# Patient Record
Sex: Female | Born: 1965 | Race: White | Hispanic: No | Marital: Married | Smoking: Never smoker
Health system: Southern US, Community
[De-identification: ages and names within clinical notes are randomized; demographics above are authoritative.]

## PROBLEM LIST (undated history)

## (undated) DIAGNOSIS — E0500 Thyrotoxicosis with diffuse goiter without thyrotoxic crisis or storm: Secondary | ICD-10-CM

## (undated) DIAGNOSIS — J45909 Unspecified asthma, uncomplicated: Secondary | ICD-10-CM

## (undated) DIAGNOSIS — G479 Sleep disorder, unspecified: Secondary | ICD-10-CM

## (undated) DIAGNOSIS — J309 Allergic rhinitis, unspecified: Secondary | ICD-10-CM

## (undated) HISTORY — DX: Unspecified asthma, uncomplicated: J45.909

## (undated) HISTORY — DX: Thyrotoxicosis with diffuse goiter without thyrotoxic crisis or storm: E05.00

## (undated) HISTORY — DX: Allergic rhinitis, unspecified: J30.9

## (undated) HISTORY — DX: Sleep disorder, unspecified: G47.9

## (undated) HISTORY — PX: CHOLECYSTECTOMY: SHX55

---

## 2013-07-19 DIAGNOSIS — N920 Excessive and frequent menstruation with regular cycle: Secondary | ICD-10-CM | POA: Insufficient documentation

## 2013-07-19 HISTORY — PX: RADICAL HYSTERECTOMY: SHX2283

## 2015-04-27 ENCOUNTER — Ambulatory Visit (INDEPENDENT_AMBULATORY_CARE_PROVIDER_SITE_OTHER): Payer: PRIVATE HEALTH INSURANCE | Admitting: Internal Medicine

## 2015-04-27 ENCOUNTER — Encounter: Payer: Self-pay | Admitting: Internal Medicine

## 2015-04-27 VITALS — BP 136/94 | HR 76 | Temp 98.7°F | Resp 18 | Ht 65.75 in | Wt 297.6 lb

## 2015-04-27 DIAGNOSIS — J3089 Other allergic rhinitis: Secondary | ICD-10-CM | POA: Insufficient documentation

## 2015-04-27 DIAGNOSIS — J45909 Unspecified asthma, uncomplicated: Secondary | ICD-10-CM | POA: Insufficient documentation

## 2015-04-27 DIAGNOSIS — J4521 Mild intermittent asthma with (acute) exacerbation: Secondary | ICD-10-CM | POA: Diagnosis not present

## 2015-04-27 LAB — PULMONARY FUNCTION TEST

## 2015-04-27 NOTE — Assessment & Plan Note (Signed)
   Well controlled  Continue cetirizine (Zyrtec) 10 mg daily  Continue to use fluticasone 2 sprays each nostril daily as needed

## 2015-04-27 NOTE — Progress Notes (Signed)
History of Present Illness: Amber Steele is a 50 y.o. female presenting for a sick visit  HPI Comments: Asthma: Patient had been doing well for the past 2 yearsntil about one month ago when she developed an upper respiratory infection along with associated wheezing and coughing.She now coughs up a scant amount of mucus but it is mostly dry in nature. She uses albuterol 2-3 times a day with only transient improvement in her symptoms. She was able to stop Asmanex at last visit and has been just using iton an as-needed basis but she has not required it.  Allergic rhinitis: Patient completed a course of immunotherapy for tree and dust mite allergy in the past. Whenever she stops cetirizine, she notices return of her symptoms. She uses as needed albuterol. She has good symptom control and has not had any interval sinus infections.   Assessment and Plan: Asthma  Intermittent, currently not well controlled Given Prednisone 10 mg tablets. Take 2 tablets twice a day for 3 days, then 2 tablets on day #4, then 1 tablet on day #5. Given a nebulization treatment in the office with improvement in her symptoms She may continue to use Asmanex 220 g 2 puffs daily as burst therapy when she is sick. Stop when she is well Continue as needed Pro Air (albuterol)  Allergic rhinitis  Well controlled  Continue cetirizine (Zyrtec) 10 mg daily  Continue to use fluticasone 2 sprays each nostril daily as needed    Return in about 4 weeks (around 05/25/2015).  Medications ordered this encounter:  Meds ordered this encounter  Medications  . DISCONTD: azithromycin (ZITHROMAX) 250 MG tablet    Sig: take 2 tablets by mouth on day 1 then 1 tablet on days 2 through 5    Refill:  0  . benzonatate (TESSALON) 100 MG capsule    Sig: Reported on 04/27/2015    Refill:  0  . fluticasone (FLONASE) 50 MCG/ACT nasal spray    Sig: instill 2 sprays into each nostril once daily for 14 days    Refill:  0  . meloxicam (MOBIC)  15 MG tablet    Sig: Reported on 04/27/2015    Refill:  0  . metoprolol succinate (TOPROL-XL) 25 MG 24 hr tablet    Sig:     Refill:  1  . RA SALINE NASAL SPRAY 0.65 % nasal spray    Sig: USE 2 SPRAYS BY NASAL ROUTE AS NEEDED FOR CONGESTION    Refill:  0  . cetirizine (ZYRTEC) 10 MG tablet    Sig: Take 10 mg by mouth daily.  . Multiple Vitamin (MULTIVITAMIN) tablet    Sig: Take 1 tablet by mouth daily.  Marland Kitchen albuterol (PROAIR HFA) 108 (90 Base) MCG/ACT inhaler    Sig: Inhale 2 puffs into the lungs every 4 (four) hours as needed for wheezing or shortness of breath.  . mometasone (ASMANEX 14 METERED DOSES) 220 MCG/INH inhaler    Sig: Inhale 2 puffs into the lungs daily.  Marland Kitchen acetaminophen (TYLENOL) 325 MG tablet    Sig: Take 650 mg by mouth every 6 (six) hours as needed.    Diagnostics: Spirometry: FEV1 1.85L or 60%, FEV1/FVC  97%.  This is consistent with severe restriction. Significant improvement but not full reversibility following bronchodilators  Physical Exam: BP 136/94 mmHg  Pulse 76  Temp(Src) 98.7 F (37.1 C) (Oral)  Resp 18  Ht 5' 5.75" (1.67 m)  Wt 297 lb 9.6 oz (134.99 kg)  BMI 48.40 kg/m2  Physical Exam  Constitutional: She appears well-developed and well-nourished. No distress.  Obese  HENT:  Right Ear: External ear normal.  Left Ear: External ear normal.  Nose: Nose normal.  Mouth/Throat: Oropharynx is clear and moist.  Eyes: Conjunctivae are normal. Right eye exhibits no discharge. Left eye exhibits no discharge.  Cardiovascular: Normal rate, regular rhythm and normal heart sounds.   No murmur heard. Pulmonary/Chest: Effort normal and breath sounds normal. No respiratory distress. She has no wheezes. She has no rales.  Occasional dry cough  Abdominal: Soft. Bowel sounds are normal.  Musculoskeletal: She exhibits no edema.  Lymphadenopathy:    She has no cervical adenopathy.  Neurological: She is alert.  Skin: No rash noted.  Vitals  reviewed.   Medications: Current outpatient prescriptions:  .  acetaminophen (TYLENOL) 325 MG tablet, Take 650 mg by mouth every 6 (six) hours as needed., Disp: , Rfl:  .  albuterol (PROAIR HFA) 108 (90 Base) MCG/ACT inhaler, Inhale 2 puffs into the lungs every 4 (four) hours as needed for wheezing or shortness of breath., Disp: , Rfl:  .  benzonatate (TESSALON) 100 MG capsule, Reported on 04/27/2015, Disp: , Rfl: 0 .  cetirizine (ZYRTEC) 10 MG tablet, Take 10 mg by mouth daily., Disp: , Rfl:  .  fluticasone (FLONASE) 50 MCG/ACT nasal spray, instill 2 sprays into each nostril once daily for 14 days, Disp: , Rfl: 0 .  meloxicam (MOBIC) 15 MG tablet, Reported on 04/27/2015, Disp: , Rfl: 0 .  metoprolol succinate (TOPROL-XL) 25 MG 24 hr tablet, , Disp: , Rfl: 1 .  mometasone (ASMANEX 14 METERED DOSES) 220 MCG/INH inhaler, Inhale 2 puffs into the lungs daily., Disp: , Rfl:  .  Multiple Vitamin (MULTIVITAMIN) tablet, Take 1 tablet by mouth daily., Disp: , Rfl:  .  RA SALINE NASAL SPRAY 0.65 % nasal spray, USE 2 SPRAYS BY NASAL ROUTE AS NEEDED FOR CONGESTION, Disp: , Rfl: 0  Drug Allergies:  Allergies  Allergen Reactions  . Codeine   . Sulfa Antibiotics     ROS: Per HPI unless specifically indicated below Review of Systems  Thank you for the opportunity to care for this patient.  Please do not hesitate to contact me with questions.

## 2015-04-27 NOTE — Assessment & Plan Note (Signed)
   Intermittent, currently not well controlled Given Prednisone 10 mg tablets. Take 2 tablets twice a day for 3 days, then 2 tablets on day #4, then 1 tablet on day #5. Given a nebulization treatment in the office with improvement in her symptoms She may continue to use Asmanex 220 g 2 puffs daily as burst therapy when she is sick. Stop when she is well Continue as needed Pro Air (albuterol)

## 2015-04-27 NOTE — Patient Instructions (Signed)
Asthma  Intermittent, currently not well controlled Given Prednisone 10 mg tablets. Take 2 tablets twice a day for 3 days, then 2 tablets on day #4, then 1 tablet on day #5. Given a nebulization treatment in the office with improvement in her symptoms She may continue to use Asmanex 220 g 2 puffs daily as burst therapy when she is sick. Stop when she is well Continue as needed Pro Air (albuterol)  Allergic rhinitis  Well controlled  Continue cetirizine (Zyrtec) 10 mg daily  Continue to use fluticasone 2 sprays each nostril daily as needed

## 2015-04-30 ENCOUNTER — Encounter: Payer: Self-pay | Admitting: Internal Medicine

## 2015-05-03 ENCOUNTER — Encounter: Payer: Self-pay | Admitting: *Deleted

## 2015-05-04 ENCOUNTER — Other Ambulatory Visit: Payer: Self-pay

## 2015-05-04 MED ORDER — MOMETASONE FUROATE 220 MCG/INH IN AEPB: 2.0000 | INHALATION_SPRAY | Freq: Every day | RESPIRATORY_TRACT | Status: DC

## 2015-05-30 ENCOUNTER — Ambulatory Visit (INDEPENDENT_AMBULATORY_CARE_PROVIDER_SITE_OTHER): Payer: No Typology Code available for payment source | Admitting: Internal Medicine

## 2015-05-30 ENCOUNTER — Encounter: Payer: Self-pay | Admitting: Internal Medicine

## 2015-05-30 VITALS — BP 120/98 | HR 68 | Temp 98.3°F | Resp 16 | Ht 65.75 in | Wt 297.4 lb

## 2015-05-30 DIAGNOSIS — J3089 Other allergic rhinitis: Secondary | ICD-10-CM | POA: Diagnosis not present

## 2015-05-30 DIAGNOSIS — J4520 Mild intermittent asthma, uncomplicated: Secondary | ICD-10-CM | POA: Diagnosis not present

## 2015-05-30 MED ORDER — FLUTICASONE PROPIONATE 50 MCG/ACT NA SUSP: NASAL | Status: DC

## 2015-05-30 MED ORDER — MOMETASONE FUROATE 200 MCG/ACT IN AERO: 2.0000 | INHALATION_SPRAY | Freq: Two times a day (BID) | RESPIRATORY_TRACT | Status: DC

## 2015-05-30 MED ORDER — ALBUTEROL SULFATE HFA 108 (90 BASE) MCG/ACT IN AERS: 2.0000 | INHALATION_SPRAY | RESPIRATORY_TRACT | Status: DC | PRN

## 2015-05-30 MED ORDER — NYSTATIN 100000 UNIT/ML MT SUSP: OROMUCOSAL | Status: DC

## 2015-05-30 NOTE — Assessment & Plan Note (Addendum)
   Intermittent, currently well controlled Switch to Asmanex HFA 200 g 2 puffs twice a day with spacer. Rinse and gargle well after administration of the medication to prevent yeast Start nystatin swish and swallow 100,000 units per mL. She will use 5 ML 4 times a day for one to 2 weeks Continue as needed Pro Air (albuterol) After the spring, she may wean down the Asmanex and discontinue if symptoms remain well controlled.

## 2015-05-30 NOTE — Assessment & Plan Note (Addendum)
   Continue cetirizine (Zyrtec) 10 mg daily  Continue to use fluticasone 2 sprays each nostril daily as needed

## 2015-05-30 NOTE — Patient Instructions (Signed)
Allergic rhinitis  Continue cetirizine (Zyrtec) 10 mg daily  Continue to use fluticasone 2 sprays each nostril daily as needed   Asthma  Intermittent, currently well controlled Switch to Asmanex HFA 200 g 2 puffs twice a day with spacer. Rinse and gargle well after administration of the medication to prevent yeast Start nystatin swish and swallow 100,000 units per mL. She will use 5 ML 4 times a day for one to 2 weeks Continue as needed Pro Air (albuterol) After the spring, she may wean down the Asmanex and discontinue if symptoms remain well controlled.

## 2015-05-30 NOTE — Progress Notes (Signed)
History of Present Illness: Amber Steele is a 50 y.o. female presenting for follow-up.  HPI Comments: Asthma: At patient's last visit, symptoms had not been well-controlled so she was given a prednisone burst. She now feels that she is back to her baseline. She did try to wean the Asmanex but due to springtime allergens, she was not able to do so.  Allergic rhinitis: Patient completed a course of immunotherapy for tree and dust mite allergy in the past. With the onset of the spring, she has been having increased symptoms but is having good control with cetirizine and fluticasone.   Current Outpatient Prescriptions on File Prior to Visit  Medication Sig Dispense Refill  . acetaminophen (TYLENOL) 325 MG tablet Take 650 mg by mouth every 6 (six) hours as needed.    Marland Kitchen. albuterol (PROAIR HFA) 108 (90 Base) MCG/ACT inhaler Inhale 2 puffs into the lungs every 4 (four) hours as needed for wheezing or shortness of breath.    . cetirizine (ZYRTEC) 10 MG tablet Take 10 mg by mouth daily.    . fluticasone (FLONASE) 50 MCG/ACT nasal spray instill 2 sprays into each nostril once daily for 14 days  0  . meloxicam (MOBIC) 15 MG tablet Reported on 04/27/2015  0  . metoprolol succinate (TOPROL-XL) 25 MG 24 hr tablet   1  . Multiple Vitamin (MULTIVITAMIN) tablet Take 1 tablet by mouth daily.    Marland Kitchen. RA SALINE NASAL SPRAY 0.65 % nasal spray USE 2 SPRAYS BY NASAL ROUTE AS NEEDED FOR CONGESTION  0  . benzonatate (TESSALON) 100 MG capsule Reported on 05/30/2015  0   No current facility-administered medications on file prior to visit.    Assessment and Plan: Allergic rhinitis  Continue cetirizine (Zyrtec) 10 mg daily  Continue to use fluticasone 2 sprays each nostril daily as needed   Asthma  Intermittent, currently well controlled Switch to Asmanex HFA 200 g 2 puffs twice a day with spacer. Rinse and gargle well after administration of the medication to prevent yeast Start nystatin swish and swallow 100,000  units per mL. She will use 5 ML 4 times a day for one to 2 weeks Continue as needed Pro Air (albuterol) After the spring, she may wean down the Asmanex and discontinue if symptoms remain well controlled.     Return in about 6 months (around 11/30/2015).  Meds ordered this encounter  Medications  . diphenhydrAMINE (BENADRYL) 25 MG tablet    Sig: Take by mouth.  . DISCONTD: metoprolol tartrate (LOPRESSOR) 25 MG tablet    Sig: Take by mouth.  . DISCONTD: cetirizine (ZYRTEC) 10 MG tablet    Sig: Take by mouth.  . Multiple Vitamin (THERA) TABS    Sig: Take by mouth.  . DISCONTD: albuterol (PROAIR HFA) 108 (90 Base) MCG/ACT inhaler    Sig:   . DISCONTD: fluticasone (FLONASE) 50 MCG/ACT nasal spray    Sig: Place into the nose.  Marland Kitchen. DISCONTD: albuterol (PROAIR HFA) 108 (90 Base) MCG/ACT inhaler    Sig:   . DISCONTD: cetirizine (ZYRTEC) 10 MG tablet    Sig: Take 10 mg by mouth.  . DISCONTD: meloxicam (MOBIC) 15 MG tablet    Sig:   . DISCONTD: metoprolol succinate (TOPROL-XL) 25 MG 24 hr tablet    Sig:   . DISCONTD: sodium chloride (OCEAN) 0.65 % nasal spray    Sig: Place into the nose.    Diagnostics: Spirometry: FEV1 3.35L or 109%, FEV1/FVC  84%.  This is a normal study.  Patient had albuterol a few hours ago.  Physical Exam: BP 120/98 mmHg  Pulse 68  Temp(Src) 98.3 F (36.8 C) (Oral)  Resp 16  Ht 5' 5.75" (1.67 m)  Wt 297 lb 6.4 oz (134.9 kg)  BMI 48.37 kg/m2   Physical Exam  Constitutional: She appears well-developed and well-nourished. No distress.  HENT:  Right Ear: External ear normal.  Left Ear: External ear normal.  Nose: Nose normal.  Adherent white patches in the posterior pharynx  Eyes: Conjunctivae are normal. Right eye exhibits no discharge. Left eye exhibits no discharge.  Cardiovascular: Normal rate, regular rhythm and normal heart sounds.   No murmur heard. Pulmonary/Chest: Effort normal and breath sounds normal. No respiratory distress. She has no  wheezes. She has no rales.  Abdominal: Soft. Bowel sounds are normal.  Musculoskeletal: She exhibits no edema.  Lymphadenopathy:    She has no cervical adenopathy.  Neurological: She is alert.  Skin: No rash noted.  Vitals reviewed.   Drug Allergies:  Allergies  Allergen Reactions  . Sulfa Antibiotics Itching, Nausea Only and Rash  . Codeine Other (See Comments)    Arm pain Arm pain  . Fluoxetine Other (See Comments)    suicidal  . Fluoxetine Hcl Other (See Comments)    suicidal  . Oxycodone-Acetaminophen Other (See Comments)    Arm pain Arm pain    ROS: Per HPI unless specifically indicated below Review of Systems  Thank you for the opportunity to care for this patient.  Please do not hesitate to contact me with questions.

## 2015-06-07 ENCOUNTER — Encounter: Payer: Self-pay | Admitting: *Deleted

## 2015-10-25 ENCOUNTER — Telehealth: Payer: Self-pay | Admitting: Internal Medicine

## 2015-11-30 ENCOUNTER — Ambulatory Visit: Payer: PRIVATE HEALTH INSURANCE | Admitting: Allergy & Immunology

## 2015-12-24 ENCOUNTER — Other Ambulatory Visit: Payer: Self-pay | Admitting: Allergy

## 2015-12-24 MED ORDER — FLUTICASONE PROPIONATE 50 MCG/ACT NA SUSP: NASAL | 5 refills | Status: DC

## 2016-03-21 ENCOUNTER — Ambulatory Visit (INDEPENDENT_AMBULATORY_CARE_PROVIDER_SITE_OTHER): Payer: No Typology Code available for payment source | Admitting: Pediatrics

## 2016-03-21 ENCOUNTER — Encounter: Payer: Self-pay | Admitting: Pediatrics

## 2016-03-21 VITALS — BP 130/86 | HR 64 | Temp 98.6°F | Resp 16

## 2016-03-21 DIAGNOSIS — J3089 Other allergic rhinitis: Secondary | ICD-10-CM | POA: Diagnosis not present

## 2016-03-21 DIAGNOSIS — E669 Obesity, unspecified: Secondary | ICD-10-CM | POA: Diagnosis not present

## 2016-03-21 DIAGNOSIS — J4541 Moderate persistent asthma with (acute) exacerbation: Secondary | ICD-10-CM

## 2016-03-21 MED ORDER — MOMETASONE FUROATE 220 MCG/INH IN AEPB: 2.0000 | INHALATION_SPRAY | Freq: Two times a day (BID) | RESPIRATORY_TRACT | 5 refills | Status: DC

## 2016-03-21 MED ORDER — ALBUTEROL SULFATE (2.5 MG/3ML) 0.083% IN NEBU: 2.5000 mg | INHALATION_SOLUTION | RESPIRATORY_TRACT | 1 refills | Status: DC | PRN

## 2016-03-21 MED ORDER — ALBUTEROL SULFATE HFA 108 (90 BASE) MCG/ACT IN AERS: 2.0000 | INHALATION_SPRAY | RESPIRATORY_TRACT | 1 refills | Status: DC | PRN

## 2016-03-21 NOTE — Patient Instructions (Addendum)
Asmanex HFA 200-2 puffs twice a day for 2 weeks then back to 2 puffs once a day if you're cough and  wheeze free Pro-air 2 puffs every 4 hours if needed, or  instead albuterol 0.083% one unit dose every 4 hours if needed for coughing or wheezing Zyrtec 10 mg once a day Fluticasone 1 spray per nostril twice a day Add prednisone 20 mg twice a day for 3 days, 20 mg on the fourth day, 10 mg on the fifth day to bring your asthma under control Call me if you are not doing well on this treatment plan

## 2016-03-21 NOTE — Progress Notes (Signed)
[address] [place] [address] Dept: [phone]  FOLLOW UP NOTE  Patient ID: Amber Steele, female    DOB: 1966-02-19  Age: 51 y.o. MRN: [id number] Date of Office Visit: 03/21/2016  Assessment  Chief Complaint: Asthma (pt started coughing after christmas day, she had a low grade fever, drainage in throat)  HPI [name] presents for evaluation of some severe coughing spells and asthma attacks after having a cold about a week ago. She may have had a very low-grade fever but no chills or body aches. She usually takes Asmanex 200 HFA 2  puffs once a day but last week she increased it to 2 puffs twice a day. She had been doing well until recently following her last visit in March 2016. She has had mild nasal congestion.  Current medications are Asmanex 200 HFA-2 puffs twice a day, Zyrtec 10 mg once a day, Pro-air 2 puffs every 4 hours if needed or instead albuterol 0.083% one unit dose every 4 hours if needed, fluticasone 1 spray per nostril twice a day. Her other medications are outlined in the chart.     Drug Allergies:  Allergies  Allergen Reactions  . Sulfa Antibiotics Itching, Nausea Only and Rash  . Codeine Other (See Comments)    Arm pain Arm pain  . Fluoxetine Other (See Comments)    suicidal  . Fluoxetine Hcl Other (See Comments)    suicidal  . Oxycodone-Acetaminophen Other (See Comments)    Arm pain Arm pain    Physical Exam: BP 130/86   Pulse 64   Temp 98.6 F (37 C) (Oral)   Resp 16    Physical Exam  Constitutional: She is oriented to person, place, and time. She appears well-developed and well-nourished.  HENT:  Eyes normal. Ears normal. Nose mild swelling of nasal turbinates. Pharynx normal.  Neck: Neck supple.  Cardiovascular:  S1 and S2 normal no murmurs  Pulmonary/Chest:  Clear to percussion auscultation except for mild wheezing on end expiration  Lymphadenopathy:    She has no cervical adenopathy.  Neurological: She is alert and oriented  to person, place, and time.  Psychiatric: She has a normal mood and affect. Her behavior is normal. Judgment and thought content normal.  Vitals reviewed.   Diagnostics:  FVC 2.10 L FEV1 1.92 L. Predicted FVC 3.68 L predicted FEV1 2.92 L. After albuterol by nebulization the FVC 2.21 L FEV1 2.04 L-this shows a moderate reduction in the forced vital capacity with no  significant improvement after albuterol  Assessment and Plan: 1. Moderate persistent asthma with acute exacerbation   2. Other allergic rhinitis   3. Obesity (BMI 30-39.9)     Meds ordered this encounter  Medications  . albuterol (PROAIR HFA) 108 (90 Base) MCG/ACT inhaler    Sig: Inhale 2 puffs into the lungs every 4 (four) hours as needed for wheezing or shortness of breath.    Dispense:  1 Inhaler    Refill:  1  . mometasone (ASMANEX 120 METERED DOSES) 220 MCG/INH inhaler    Sig: Inhale 2 puffs into the lungs 2 (two) times daily.    Dispense:  1 Inhaler    Refill:  5    To prevent cough or wheeze.  Marland Kitchen albuterol (PROVENTIL) (2.5 MG/3ML) 0.083% nebulizer solution    Sig: Take 3 mLs (2.5 mg total) by nebulization every 4 (four) hours as needed for wheezing or shortness of breath.    Dispense:  75 mL    Refill:  1  Patient Instructions  Asmanex HFA 200-2 puffs twice a day for 2 weeks then back to 2 puffs once a day if you're cough and  wheeze free Pro-air 2 puffs every 4 hours if needed, or  instead albuterol 0.083% one unit dose every 4 hours if needed for coughing or wheezing Zyrtec 10 mg once a day Fluticasone 1 spray per nostril twice a day Add prednisone 20 mg twice a day for 3 days, 20 mg on the fourth day, 10 mg on the fifth day to bring your asthma under control Call me if you are not doing well on this treatment plan   Return in about 6 weeks (around 05/02/2016).    Thank you for the opportunity to care for this patient.  Please do not hesitate to contact me with questions.  Tonette BihariJ. A. Bardelas,  M.D.  [facility] [address] [place], [place] [address] [phone]

## 2016-06-04 ENCOUNTER — Other Ambulatory Visit: Payer: Self-pay | Admitting: Allergy

## 2016-06-04 MED ORDER — FLUTICASONE PROPIONATE 50 MCG/ACT NA SUSP: NASAL | 5 refills | Status: DC

## 2018-09-06 ENCOUNTER — Other Ambulatory Visit: Payer: Self-pay | Admitting: *Deleted

## 2018-09-06 MED ORDER — ALBUTEROL SULFATE (2.5 MG/3ML) 0.083% IN NEBU: 2.5000 mg | INHALATION_SOLUTION | RESPIRATORY_TRACT | 0 refills | Status: DC | PRN

## 2018-09-06 NOTE — Telephone Encounter (Signed)
Spoke with pt- humidity is flaring her asthma. She is using proair 2-3 puffs for the past 3-4 days. She is also using flovent 170mcg 2 puffs bid. Pt has nebulizer and albuterol 0.083 that she thinks is expired- I sent new script of albuterol 0.083 to pharmacy. Instructed her to try the treatments in place of using proair to help open her up more. Pt has apt 6/29 and if she is not any better by Wednesday he will call back.

## 2018-09-13 ENCOUNTER — Encounter: Payer: Self-pay | Admitting: Pediatrics

## 2018-09-13 ENCOUNTER — Other Ambulatory Visit: Payer: Self-pay

## 2018-09-13 ENCOUNTER — Ambulatory Visit (INDEPENDENT_AMBULATORY_CARE_PROVIDER_SITE_OTHER): Payer: PRIVATE HEALTH INSURANCE | Admitting: Pediatrics

## 2018-09-13 VITALS — BP 118/68 | HR 78 | Temp 98.2°F | Resp 18 | Ht 65.5 in | Wt 311.0 lb

## 2018-09-13 DIAGNOSIS — J3089 Other allergic rhinitis: Secondary | ICD-10-CM | POA: Diagnosis not present

## 2018-09-13 DIAGNOSIS — J4541 Moderate persistent asthma with (acute) exacerbation: Secondary | ICD-10-CM

## 2018-09-13 DIAGNOSIS — Z6841 Body Mass Index (BMI) 40.0 and over, adult: Secondary | ICD-10-CM | POA: Diagnosis not present

## 2018-09-13 MED ORDER — BUDESONIDE-FORMOTEROL FUMARATE 160-4.5 MCG/ACT IN AERO: 2.0000 | INHALATION_SPRAY | Freq: Two times a day (BID) | RESPIRATORY_TRACT | 5 refills | Status: DC

## 2018-09-13 NOTE — Patient Instructions (Addendum)
Asthma Begin Symbicort 160-2 puffs twice a day with a spacer to prevent cough and wheeze. This will replace Flovent 110 Continue ProAir 2 puffs every 4 hours as needed for cough or wheeze or instead albuterol via nebulizer every 4 hours as needed for cough and wheeze Begin prednisone 10 mg tablets. Take 2 tablets twice a day for 3 days, then take 2 tablets for 1 day, then take 1 tablet on the 5th day, then stop  Allergic rhinitis Continue cetirizine 10 mg once a day as needed for a runny nose Continue Flonase 2 sprays in each nostril once a day as needed for a stuffy nose Consider nasal saline rinses as needed for nasal symptoms  Call the clinic if this treatment plan is not working well for you  Follow up in 4 weeks or sooner if needed

## 2018-09-13 NOTE — Progress Notes (Signed)
[address] [place] [address] Dept: [phone]  FOLLOW UP NOTE  Patient ID: Amber Steele, female    DOB: 07-22-1965  Age: 53 y.o. MRN: [id number] Date of Office Visit: 09/13/2018  Assessment  Chief Complaint: Asthma (tightness in chest, some wheezing, "quivering in her lungs". states this has been ongoing for some time now and she felt about 2-3 weeks that she was worsening to the point that she needed to be seen. )  HPI Amber Steele is a 53 year old female who presents to the clinic for an asthma exacerbation. She was last seen in this clinic on 03/21/2016 by Dr. Shaune Leeks. At today's visit, she reports that her asthma has not been well controlled. She reports she has been feeling ill since late December and her breathing began to worsen in March with the increase in humidity. At that time, she increased her Flovent 110 to 2 puffs twice a day and was using her albuterol infrequently. At that time, she began to feel a soreness in her chest bilaterally mid rib. One week ago she increased her albuterol use to 2-3 times a day and continues on Flovent 110-2 puffs twice a day. At today's visit, she reports her breathing is beginning to improve, however, she continues to experience shortness of breath worse with activity, and a little cough with the shortness of breath. She reports the "weird pains" in her rib area are beginning to resolve and right is more painful than the left side. Allergic rhinitis is reported as well controlled with Flonase and cetirizine daily. Her current medications are listed in the chart.   Drug Allergies:  Allergies  Allergen Reactions  . Sulfa Antibiotics Itching, Nausea Only and Rash  . Codeine Other (See Comments)    Arm pain Arm pain  . Fluoxetine Other (See Comments)    suicidal  . Fluoxetine Hcl Other (See Comments)    suicidal  . Oxycodone-Acetaminophen Other (See Comments)    Arm pain Arm pain    Physical Exam: BP 118/68 (BP Location: Right Arm,  Patient Position: Sitting, Cuff Size: Large)   Pulse 78   Temp 98.2 F (36.8 C) (Oral)   Resp 18   Ht 5' 5.5" (1.664 m)   Wt (!) 311 lb (141.1 kg)   SpO2 97%   BMI 50.97 kg/m    Physical Exam Vitals signs reviewed.  Constitutional:      Appearance: Normal appearance.  HENT:     Head: Normocephalic and atraumatic.     Right Ear: Tympanic membrane normal.     Left Ear: Tympanic membrane normal.     Nose:     Comments: Bilateral nares normal.  Pharynx normal.  Ears normal.  Eyes normal.    Mouth/Throat:     Pharynx: Oropharynx is clear.  Eyes:     Conjunctiva/sclera: Conjunctivae normal.  Neck:     Musculoskeletal: Normal range of motion and neck supple.  Cardiovascular:     Rate and Rhythm: Normal rate and regular rhythm.     Heart sounds: Normal heart sounds. No murmur.  Pulmonary:     Effort: Pulmonary effort is normal.     Breath sounds: Normal breath sounds.     Comments: Lungs clear to auscultation Musculoskeletal: Normal range of motion.     Comments: Bilateral rib pain reproducible by gentle pressure  Skin:    General: Skin is warm and dry.  Neurological:     Mental Status: She is alert and oriented to person, place,  and time.  Psychiatric:        Mood and Affect: Mood normal.        Behavior: Behavior normal.        Thought Content: Thought content normal.        Judgment: Judgment normal.     Diagnostics: FVC 3.01, FEV1 2.86.  Predicted FVC 3.79, predicted FEV1 2.99.  Spirometry indicates mild restriction.  This is consistent with previous spirometry readings.  Assessment and Plan: 1. Moderate persistent asthma with acute exacerbation   2. Other allergic rhinitis   3. Class 3 severe obesity with body mass index (BMI) of 50.0 to 59.9 in adult, unspecified obesity type, unspecified whether serious comorbidity present (HCC)     Meds ordered this encounter  Medications  . budesonide-formoterol (SYMBICORT) 160-4.5 MCG/ACT inhaler    Sig: Inhale 2 puffs  into the lungs 2 (two) times daily.    Dispense:  1 Inhaler    Refill:  5    Patient Instructions  Asthma Begin Symbicort 160-2 puffs twice a day with a spacer to prevent cough and wheeze. This will replace Flovent 110 Continue ProAir 2 puffs every 4 hours as needed for cough or wheeze or instead albuterol via nebulizer every 4 hours as needed for cough and wheeze Begin prednisone 10 mg tablets. Take 2 tablets twice a day for 3 days, then take 2 tablets for 1 day, then take 1 tablet on the 5th day, then stop  Allergic rhinitis Continue cetirizine 10 mg once a day as needed for a runny nose Continue Flonase 2 sprays in each nostril once a day as needed for a stuffy nose Consider nasal saline rinses as needed for nasal symptoms  Call the clinic if this treatment plan is not working well for you  Follow up in 4 weeks or sooner if needed   Return in about 4 weeks (around 10/11/2018), or if symptoms worsen or fail to improve.   Thank you for the opportunity to care for this patient.  Please do not hesitate to contact me with questions.  Thermon LeylandAnne Ambs, FNP [facility]  I have provided oversight concerning Thermon Leylandnne Ambs' evaluation and treatment of this patient's health issues addressed during today's encounter. I agree with the assessment and therapeutic plan as outlined in the note.   Thank you for the opportunity to care for this patient.  Please do not hesitate to contact me with questions.  Tonette BihariJ. A. Aquita Simmering, M.D.  [facility] [address] [place], [place] [address] [phone]

## 2018-09-22 ENCOUNTER — Telehealth: Payer: Self-pay | Admitting: Pediatrics

## 2018-09-22 MED ORDER — PREDNISONE 10 MG PO TABS: ORAL_TABLET | ORAL | 0 refills | Status: DC

## 2018-09-22 MED ORDER — ALBUTEROL SULFATE (2.5 MG/3ML) 0.083% IN NEBU: 2.5000 mg | INHALATION_SOLUTION | RESPIRATORY_TRACT | 1 refills | Status: DC | PRN

## 2018-09-22 NOTE — Telephone Encounter (Signed)
Toward the end of the prednisone pak she was able to walk incline and was doing much better. She finished the prednisone on Friday and since Monday she has been gasping and coughing again. She is using sybmicort as precribed and albuterol nebulizer treatments 1-2 times a day. Please advise.

## 2018-09-22 NOTE — Telephone Encounter (Signed)
PT called to report she completed prednisone pack, 5 days ago. Tapered off as directed. Current symptoms cough, chest tightness. Using symbicort 2 puffs in am, 2 puffs in pm and also doing breathing treatments.  Wants to know if she can get more tubing and if it is okay to continue breathing treatments.

## 2018-09-22 NOTE — Telephone Encounter (Signed)
Pt informed

## 2018-09-22 NOTE — Telephone Encounter (Signed)
Can you please add on a day pred pack and also add the Flovent 110-2 puffs twice a day back into her routine for 2 weeks(in addition to Symbicort). If symptoms are worse or not significantly improved by next week,please have her come in to check breathing

## 2018-10-11 ENCOUNTER — Other Ambulatory Visit: Payer: Self-pay

## 2018-10-11 ENCOUNTER — Encounter: Payer: Self-pay | Admitting: Family Medicine

## 2018-10-11 ENCOUNTER — Ambulatory Visit (INDEPENDENT_AMBULATORY_CARE_PROVIDER_SITE_OTHER): Payer: PRIVATE HEALTH INSURANCE | Admitting: Family Medicine

## 2018-10-11 VITALS — BP 110/78 | HR 64 | Temp 98.5°F | Resp 16

## 2018-10-11 DIAGNOSIS — J4541 Moderate persistent asthma with (acute) exacerbation: Secondary | ICD-10-CM

## 2018-10-11 DIAGNOSIS — Z6841 Body Mass Index (BMI) 40.0 and over, adult: Secondary | ICD-10-CM

## 2018-10-11 DIAGNOSIS — J3089 Other allergic rhinitis: Secondary | ICD-10-CM | POA: Diagnosis not present

## 2018-10-11 MED ORDER — MONTELUKAST SODIUM 10 MG PO TABS: ORAL_TABLET | ORAL | 5 refills | Status: DC

## 2018-10-11 NOTE — Patient Instructions (Signed)
Asthma Begin montelukast 10- mg once a day to prevent cough or wheeze Continue Smbicort 160-2 puffs twice a day with a spacer to prevent cough and wheeze. This will replace Flovent 110 Continue ProAir 2 puffs every 4 hours as needed for cough or wheeze or instead albuterol via nebulizer every 4 hours as needed for cough and wheeze  Allergic rhinitis Continue cetirizine 10 mg once a day as needed for a runny nose Continue Flonase 2 sprays in each nostril once a day as needed for a stuffy nose Consider nasal saline rinses as needed for nasal symptoms  Call the clinic if this treatment plan is not working well for you  Follow up in 2 months or sooner if needed

## 2018-10-11 NOTE — Progress Notes (Signed)
[address] [place] [id number] Dept: [phone]  FOLLOW UP NOTE  Patient ID: Amber Steele, female    DOB: 1966/01/11  Age: 53 y.o. MRN: [id number] Date of Office Visit: 10/11/2018  Assessment  Chief Complaint: Asthma (still having SOB and chest tightness)  HPI Amber Steele is a 53 year old female who presents to the clinic for a follow up visit. She reports her asthma has improved since her last visit to this clinic. She required two rounds of prednisone and the addition of Flovent 110 to Symbicort 160. She continues to experience shortness of breath with a "popping and crackling feeling", and a "minor dry cough" that occurs a couple times a week. She reports that she continues to experience chest tightness when she is exposed to hot or humid weather. She continues to use Symbicort 160-2 puffs twice a day with a spacer and albuterol about 1 time a week.  She reports that she continues to experience tenderness in her right side below her breast and in her back.  She reports that when she is at home she can relax this area and it is not painful.  She denies fever and productive cough.  Allergic rhinitis is reported as well controlled with Flonase and cetirizine once a day.  Her current medications are listed in the chart   Drug Allergies:  Allergies  Allergen Reactions  . Sulfa Antibiotics Itching, Nausea Only and Rash  . Codeine Other (See Comments)    Arm pain   . Fluoxetine Hcl Other (See Comments)    suicidal  . Oxycodone-Acetaminophen Other (See Comments)    Arm pain     Physical Exam: BP 110/78   Pulse 64   Temp 98.5 F (36.9 C) (Oral)   Resp 16   SpO2 96%    Physical Exam Vitals signs reviewed.  Constitutional:      Appearance: Normal appearance.  HENT:     Head: Normocephalic and atraumatic.     Right Ear: Tympanic membrane normal.     Left Ear: Tympanic membrane normal.     Nose:     Comments: Bilateral nares slightly erythematous with clear nasal  drainage noted.  Pharynx normal.  Ears normal.  Eyes normal.    Mouth/Throat:     Pharynx: Oropharynx is clear.  Eyes:     Conjunctiva/sclera: Conjunctivae normal.  Neck:     Musculoskeletal: Normal range of motion and neck supple.  Cardiovascular:     Rate and Rhythm: Normal rate and regular rhythm.     Heart sounds: Normal heart sounds. No murmur.  Pulmonary:     Effort: Pulmonary effort is normal.     Breath sounds: Normal breath sounds.     Comments: Lungs clear to auscultation Musculoskeletal: Normal range of motion.     Comments: Right second rib cage non-reproducible by pressure  Skin:    General: Skin is warm and dry.  Neurological:     Mental Status: She is alert and oriented to person, place, and time.  Psychiatric:        Mood and Affect: Mood normal.        Behavior: Behavior normal.        Thought Content: Thought content normal.        Judgment: Judgment normal.     Diagnostics: FVC 3.60, FEV1 3.30.  Predicted FVC 3.79, predicted FEV1 2.99.  Spirometry indicates normal ventilatory function.  Assessment and Plan: 1. Moderate persistent asthma with acute exacerbation   2.  Other allergic rhinitis   3. Class 3 severe obesity with body mass index (BMI) of 50.0 to 59.9 in adult, unspecified obesity type, unspecified whether serious comorbidity present ([place])     Meds ordered this encounter  Medications  . montelukast (SINGULAIR) 10 MG tablet    Sig: One tablet at bedtime to prevent coughing or wheezing    Dispense:  30 tablet    Refill:  5    Patient Instructions  Asthma Begin montelukast 10- mg once a day to prevent cough or wheeze Continue Smbicort 160-2 puffs twice a day with a spacer to prevent cough and wheeze. This will replace Flovent 110 Continue ProAir 2 puffs every 4 hours as needed for cough or wheeze or instead albuterol via nebulizer every 4 hours as needed for cough and wheeze  Allergic rhinitis Continue cetirizine 10 mg once a day as needed  for a runny nose Continue Flonase 2 sprays in each nostril once a day as needed for a stuffy nose Consider nasal saline rinses as needed for nasal symptoms  Call the clinic if this treatment plan is not working well for you  Follow up in 2 months or sooner if needed   Return in about 2 months (around 12/12/2018), or if symptoms worsen or fail to improve.   Thank you for the opportunity to care for this patient.  Please do not hesitate to contact me with questions.  Gareth Morgan, FNP [facility]  I have provided oversight concerning Gareth Morgan' evaluation and treatment of this patient's health issues addressed during today's encounter. I agree with the assessment and therapeutic plan as outlined in the note.   Thank you for the opportunity to care for this patient.  Please do not hesitate to contact me with questions.  Penne Lash, M.D.  [facility] [address] [place], [place] [address] [phone]

## 2018-10-12 ENCOUNTER — Telehealth: Payer: Self-pay | Admitting: *Deleted

## 2018-10-12 DIAGNOSIS — R0781 Pleurodynia: Secondary | ICD-10-CM

## 2018-10-12 NOTE — Telephone Encounter (Signed)
Spoke with pt and ordered chest xray.

## 2018-10-12 NOTE — Telephone Encounter (Signed)
-----   Message from Dara Hoyer, FNP sent at 10/11/2018  6:38 PM EDT ----- Can you please order a 2 view chest xray for this patient for right sided rib pain lasting over 6 weeks. Please let her know of the order. Thank you

## 2018-10-13 ENCOUNTER — Other Ambulatory Visit: Payer: Self-pay

## 2018-10-13 ENCOUNTER — Ambulatory Visit (HOSPITAL_BASED_OUTPATIENT_CLINIC_OR_DEPARTMENT_OTHER)
Admission: RE | Admit: 2018-10-13 | Discharge: 2018-10-13 | Disposition: A | Discharge status: Home / Self Care | Payer: 59 | Source: Ambulatory Visit | Attending: Family Medicine | Admitting: Family Medicine

## 2018-10-13 DIAGNOSIS — R0781 Pleurodynia: Secondary | ICD-10-CM | POA: Diagnosis present

## 2018-10-14 NOTE — Telephone Encounter (Signed)
Can you please let this patient know that her cxr indicates both lungs are clear and skeletal structures are normal. Thank you

## 2018-10-18 NOTE — Telephone Encounter (Signed)
Can you please try to call this patient once more to give her the cxr result. If no answer can we please mail? Thank you

## 2018-12-16 ENCOUNTER — Ambulatory Visit: Payer: No Typology Code available for payment source | Admitting: Family Medicine

## 2018-12-27 ENCOUNTER — Ambulatory Visit (INDEPENDENT_AMBULATORY_CARE_PROVIDER_SITE_OTHER): Payer: PRIVATE HEALTH INSURANCE | Admitting: Family Medicine

## 2018-12-27 ENCOUNTER — Encounter: Payer: Self-pay | Admitting: Family Medicine

## 2018-12-27 ENCOUNTER — Other Ambulatory Visit: Payer: Self-pay

## 2018-12-27 VITALS — BP 98/68 | HR 64 | Temp 98.1°F | Resp 12 | Ht 65.0 in | Wt 311.6 lb

## 2018-12-27 DIAGNOSIS — Z23 Encounter for immunization: Secondary | ICD-10-CM | POA: Diagnosis not present

## 2018-12-27 DIAGNOSIS — J3089 Other allergic rhinitis: Secondary | ICD-10-CM

## 2018-12-27 DIAGNOSIS — Z6841 Body Mass Index (BMI) 40.0 and over, adult: Secondary | ICD-10-CM

## 2018-12-27 DIAGNOSIS — J4540 Moderate persistent asthma, uncomplicated: Secondary | ICD-10-CM

## 2018-12-27 DIAGNOSIS — K219 Gastro-esophageal reflux disease without esophagitis: Secondary | ICD-10-CM | POA: Diagnosis not present

## 2018-12-27 MED ORDER — FAMOTIDINE 20 MG PO TABS: ORAL_TABLET | ORAL | 5 refills | Status: DC

## 2018-12-27 NOTE — Patient Instructions (Addendum)
Asthma Continue montelukast 10- mg once a day to prevent cough or wheeze Continue Smbicort 160-2 puffs twice a day with a spacer to prevent cough and wheeze. This will replace Flovent 110 Continue ProAir 2 puffs every 4 hours as needed for cough or wheeze or instead albuterol via nebulizer every 4 hours as needed for cough and wheeze. You may use ProAir 5-15 minutes before exercise to prevent cough or wheeze  Allergic rhinitis Continue cetirizine 10 mg once a day as needed for a runny nose Continue Flonase 2 sprays in each nostril once a day as needed for a stuffy nose Consider nasal saline rinses as needed for nasal symptoms  Reflux Begin famotidine 20 mg twice a day to decrease reflux Continue dietary and lifestyle modification as below  Call the clinic if this treatment plan is not working well for you  Follow up in 2 months or sooner if needed   Lifestyle Changes for Controlling GERD When you have GERD, stomach acid feels as if it's backing up toward your mouth. Whether or not you take medication to control your GERD, your symptoms can often be improved with lifestyle changes.   Raise Your Head  Reflux is more likely to strike when you're lying down flat, because stomach fluid can  flow backward more easily. Raising the head of your bed 4-6 inches can help. To do this:  Slide blocks or books under the legs at the head of your bed. Or, place a wedge under  the mattress. Many foam stores can make a suitable wedge for you. The wedge  should run from your waist to the top of your head.  Don't just prop your head on several pillows. This increases pressure on your  stomach. It can make GERD worse.  Watch Your Eating Habits Certain foods may increase the acid in your stomach or relax the lower esophageal sphincter, making GERD more likely. It's best to avoid the following:  Coffee, tea, and carbonated drinks (with and without caffeine)  Fatty, fried, or spicy food  Mint,  chocolate, onions, and tomatoes  Any other foods that seem to irritate your stomach or cause you pain  Relieve the Pressure  Eat smaller meals, even if you have to eat more often.  Don't lie down right after you eat. Wait a few hours for your stomach to empty.  Avoid tight belts and tight-fitting clothes.  Lose excess weight.  Tobacco and Alcohol  Avoid smoking tobacco and drinking alcohol. They can make GERD symptoms worse.

## 2018-12-27 NOTE — Progress Notes (Signed)
[address] [place] [address] Dept: [phone]  FOLLOW UP NOTE  Patient ID: Amber Steele, female    DOB: 12/12/1965  Age: 53 y.o. MRN: [id number] Date of Office Visit: 12/27/2018  Assessment  Chief Complaint: Asthma  HPI Amber Steele is a 53 year old female who presents to the clinic for a follow up visit. She reports her asthma is "much better" than at the last visit. She reports occasional shortness of breath and wheezing which is worst while wearing the mask and when lying down. She continues montelukast 10 mg once a day, Symbicort 160-2 puffs twcie a day with a spacer, and uses her albuterol infrequently. She denies heartburn of reflux at this time, however, she reports that she "can't breathe" while lying down and sleeps propped up in order to breathe. Allergic rhinitis is reported as moderately well controlled with occasional clear rhinorrhea, post nasal drainage, and scratchy throat for which she uses cetirizine, saline nasal rinses and Flonase daily with relief of symptoms. Her current medications are listed in the chart.    Drug Allergies:  Allergies  Allergen Reactions  . Sulfa Antibiotics Itching, Nausea Only and Rash  . Codeine Other (See Comments)    Arm pain   . Fluoxetine Hcl Other (See Comments)    suicidal  . Oxycodone-Acetaminophen Other (See Comments)    Arm pain     Physical Exam: BP 98/68 (BP Location: Right Arm, Patient Position: Sitting, Cuff Size: Large)   Pulse 64   Temp 98.1 F (36.7 C) (Oral)   Resp 12   Ht 5\' 5"  (1.651 m)   Wt (!) 311 lb 9.6 oz (141.3 kg)   SpO2 97%   BMI 51.85 kg/m    Physical Exam Vitals signs reviewed.  HENT:     Head: Normocephalic and atraumatic.     Nose:     Comments: Bilateral nares slightly erythematous with no nasal drainage noted. Pharynx normal. Ears normal. Eyes normal.    Mouth/Throat:     Pharynx: Oropharynx is clear.  Eyes:     Conjunctiva/sclera: Conjunctivae normal.  Neck:   Musculoskeletal: Normal range of motion and neck supple.  Cardiovascular:     Rate and Rhythm: Normal rate and regular rhythm.     Heart sounds: Normal heart sounds. No murmur.  Pulmonary:     Effort: Pulmonary effort is normal.     Breath sounds: Normal breath sounds.     Comments: Lungs clear to auscultation Musculoskeletal: Normal range of motion.  Skin:    General: Skin is warm and dry.  Neurological:     Mental Status: She is alert and oriented to person, place, and time.  Psychiatric:        Mood and Affect: Mood normal.        Behavior: Behavior normal.        Thought Content: Thought content normal.        Judgment: Judgment normal.     Diagnostics: FVC 3.60, FEV1 3.16. Predicted FVC 3.64, FEV1 2.87. Spirometry indicates normal ventilatory function.   Assessment and Plan: 1. Moderate persistent asthma without complication   2. Other allergic rhinitis   3. Class 3 severe obesity with body mass index (BMI) of 50.0 to 59.9 in adult, unspecified obesity type, unspecified whether serious comorbidity present ([facility])   4. Gastroesophageal reflux disease, unspecified whether esophagitis present   5. Need for prophylactic vaccination and inoculation against influenza     Meds ordered this encounter  Medications  .  famotidine (PEPCID) 20 MG tablet    Sig: Take 1 tablet twice a day to decrease reflux.    Dispense:  64 tablet    Refill:  5    Patient Instructions  Asthma Continue montelukast 10- mg once a day to prevent cough or wheeze Continue Smbicort 160-2 puffs twice a day with a spacer to prevent cough and wheeze. This will replace Flovent 110 Continue ProAir 2 puffs every 4 hours as needed for cough or wheeze or instead albuterol via nebulizer every 4 hours as needed for cough and wheeze. You may use ProAir 5-15 minutes before exercise to prevent cough or wheeze  Allergic rhinitis Continue cetirizine 10 mg once a day as needed for a runny nose Continue Flonase 2 sprays  in each nostril once a day as needed for a stuffy nose Consider nasal saline rinses as needed for nasal symptoms  Reflux Begin famotidine 20 mg twice a day to decrease reflux Continue dietary and lifestyle modification as below  Call the clinic if this treatment plan is not working well for you  Follow up in 2 months or sooner if needed  Return in about 2 months (around 02/26/2019), or if symptoms worsen or fail to improve.   Thank you for the opportunity to care for this patient.  Please do not hesitate to contact me with questions.  Gareth Morgan, FNP [facility]  I have provided oversight concerning Gareth Morgan' evaluation and treatment of this patient's health issues addressed during today's encounter. I agree with the assessment and therapeutic plan as outlined in the note.   Thank you for the opportunity to care for this patient.  Please do not hesitate to contact me with questions.  Penne Lash, M.D.  [facility] [address] [place], [place] [address] [phone]

## 2019-03-02 ENCOUNTER — Ambulatory Visit: Payer: PRIVATE HEALTH INSURANCE | Admitting: Family Medicine

## 2019-04-05 ENCOUNTER — Other Ambulatory Visit: Payer: Self-pay

## 2019-04-05 MED ORDER — BUDESONIDE-FORMOTEROL FUMARATE 160-4.5 MCG/ACT IN AERO: 2.0000 | INHALATION_SPRAY | Freq: Two times a day (BID) | RESPIRATORY_TRACT | 0 refills | Status: DC

## 2019-05-08 ENCOUNTER — Other Ambulatory Visit: Payer: Self-pay | Admitting: Family Medicine

## 2019-05-17 ENCOUNTER — Other Ambulatory Visit: Payer: Self-pay | Admitting: Family Medicine

## 2019-06-06 ENCOUNTER — Other Ambulatory Visit: Payer: Self-pay | Admitting: Family Medicine

## 2019-06-17 ENCOUNTER — Other Ambulatory Visit: Payer: Self-pay | Admitting: Family Medicine

## 2019-08-08 ENCOUNTER — Ambulatory Visit (INDEPENDENT_AMBULATORY_CARE_PROVIDER_SITE_OTHER): Payer: PRIVATE HEALTH INSURANCE | Admitting: Allergy

## 2019-08-08 ENCOUNTER — Other Ambulatory Visit: Payer: Self-pay

## 2019-08-08 ENCOUNTER — Encounter: Payer: Self-pay | Admitting: Allergy

## 2019-08-08 VITALS — BP 128/100 | HR 70 | Temp 97.8°F | Resp 19 | Ht 66.0 in | Wt 310.6 lb

## 2019-08-08 DIAGNOSIS — J3089 Other allergic rhinitis: Secondary | ICD-10-CM | POA: Diagnosis not present

## 2019-08-08 DIAGNOSIS — J4541 Moderate persistent asthma with (acute) exacerbation: Secondary | ICD-10-CM | POA: Diagnosis not present

## 2019-08-08 DIAGNOSIS — K219 Gastro-esophageal reflux disease without esophagitis: Secondary | ICD-10-CM | POA: Diagnosis not present

## 2019-08-08 MED ORDER — BUDESONIDE-FORMOTEROL FUMARATE 160-4.5 MCG/ACT IN AERO: 2.0000 | INHALATION_SPRAY | Freq: Two times a day (BID) | RESPIRATORY_TRACT | 5 refills | Status: DC

## 2019-08-08 MED ORDER — AZELASTINE HCL 0.1 % NA SOLN: 1.0000 | Freq: Two times a day (BID) | NASAL | 5 refills | Status: DC | PRN

## 2019-08-08 MED ORDER — ALBUTEROL SULFATE HFA 108 (90 BASE) MCG/ACT IN AERS: 2.0000 | INHALATION_SPRAY | RESPIRATORY_TRACT | 1 refills | Status: DC | PRN

## 2019-08-08 MED ORDER — MONTELUKAST SODIUM 10 MG PO TABS: ORAL_TABLET | ORAL | 5 refills | Status: DC

## 2019-08-08 NOTE — Assessment & Plan Note (Signed)
Was doing well up until 1 week ago and having increased dyspnea on exertion, chest tightness, coughing. She also had temp of 99.5 but negative covid-19 test. ran out of Singulair 1 month ago and decreased Symbicort to 1 puff twice a day around the same time.  Not up to date with COVID-19 vaccine.  ACT score 16.  Today's spirometry was unremarkable.  She is most likely having an acute exacerbation due to decrease of medications.  Start prednisone taper. Prednisone 10mg  tablets - take 2 tablets for 4 days then 1 tablet on day 5.  . Use albuterol nebulizer before using Symbicort twice a day for the next few days.  . Daily controller medication(s): continue with Symbicort 160 2 puffs twice a day with spacer and rinse mouth afterwards. o Restart montelukast (singulair) 10mg  daily at night.  . Prior to physical activity: May use albuterol rescue inhaler 2 puffs 5 to 15 minutes prior to strenuous physical activities. Rescue medications: May use albuterol rescue inhaler 2 puffs every 4 to 6 hours as needed for shortness of breath, chest tightness, coughing, and wheezing. Monitor frequency of use.  . Repeat spirometry at next visit. . If still having issues then consider adding Spiriva 1.15mcg to above regimen.

## 2019-08-08 NOTE — Assessment & Plan Note (Signed)
Stable with below regimen. Having some PND now.  Continue with Zyrtec 10mg  at night.  Nasal saline spray (i.e., Simply Saline) or nasal saline lavage (i.e., NeilMed) is recommended as needed and prior to medicated nasal sprays.  Continue with Flonase 2 sprays once a day.  May use azelastine 1-2 sprays per nostril twice a day for drainage.  Consider repeat testing in future.

## 2019-08-08 NOTE — Patient Instructions (Addendum)
Asthma: Start prednisone taper. Prednisone 10mg  tablets - take 2 tablets for 4 days then 1 tablet on day 5.  . Use your albuterol nebulizer before using Symbicort twice a day for the next few days.  . Daily controller medication(s): continue with Symbicort 160 2 puffs twice a day with spacer and rinse mouth afterwards. o Restart montelukast (singulair) 10mg  daily at night.  . Prior to physical activity: May use albuterol rescue inhaler 2 puffs 5 to 15 minutes prior to strenuous physical activities. Rescue medications: May use albuterol rescue inhaler 2 puffs every 4 to 6 hours as needed for shortness of breath, chest tightness, coughing, and wheezing. Monitor frequency of use.  . Asthma control goals:  o Full participation in all desired activities (may need albuterol before activity) o Albuterol use two times or less a week on average (not counting use with activity) o Cough interfering with sleep two times or less a month o Oral steroids no more than once a year o No hospitalizations  Environmental allergies:  Continue with Zyrtec 10mg  at night.  Nasal saline spray (i.e., Simply Saline) or nasal saline lavage (i.e., NeilMed) is recommended as needed and prior to medicated nasal sprays.  Continue with Flonase 2 sprays once a day.  May use azelastine 1-2 sprays per nostril twice a day for drainage.  Follow up in 2 months or sooner if needed in [facility] with [place].

## 2019-08-08 NOTE — Progress Notes (Signed)
Follow Up Note  RE: Amber Steele MRN: [id number] DOB: 09-Dec-1965 Date of Office Visit: 08/08/2019  Referring provider: Lelon Huh [name]* Primary care provider: Dellinger, [name]., MD  Chief Complaint: Asthma (asthma flare, coughing, using rescue inhaler )  History of Present Illness: I had the pleasure of seeing Amber Steele for a follow up visit at the [facility] on 08/08/2019. She is a 54 y.o. female, who is being followed for asthma, allergic rhinitis, reflux. Her previous allergy office visit was on 12/27/2018 with [name], FNP. Today is a new complaint visit of asthma.  Up to date with COVID-19 vaccine: no  Asthma About 1 week ago she took her daughter and niece to the mall and noted some shortness of breath while walking. She also noted some PND, and elevated temp of 99.5.  She has been getting very fatigued and her asthma has been bothering her with chest tightness, coughing.  Takes albuterol with good benefit during these episodes.   She got covid-19 tested which was negative.   Currently on Symbicort 160 2 puffs twice a day but about 1 month ago she was on 1 puff twice a day.  Patient ran out of Singulair about 1 month ago as well.   Denies any ER/urgent care visits or prednisone use since the last visit.  Allergic rhinitis Taking zyrtec 10mg  QHS, saline wash 1-2 times a day, Flonase 2 sprays once a day with some benefit.   Reflux Currently on Nexium once a day.   Assessment and Plan: [name] is a 54 y.o. female with: Moderate persistent asthma with acute exacerbation Was doing well up until 1 week ago and having increased dyspnea on exertion, chest tightness, coughing. She also had temp of 99.5 but negative covid-19 test. ran out of Singulair 1 month ago and decreased Symbicort 44 to 1 puff twice a day around the same time.  Not up to date with COVID-19 vaccine.  ACT score 16.  Today's spirometry was unremarkable.  She is most  likely having an acute exacerbation due to decrease of medications.  Start prednisone taper. Prednisone 10mg  tablets - take 2 tablets for 4 days then 1 tablet on day 5.  . Use albuterol nebulizer before using Symbicort twice a day for the next few days.  . Daily controller medication(s): continue with Symbicort 160 2 puffs twice a day with spacer and rinse mouth afterwards. o Restart montelukast (singulair) 10mg  daily at night.  . Prior to physical activity: May use albuterol rescue inhaler 2 puffs 5 to 15 minutes prior to strenuous physical activities. Rescue medications: May use albuterol rescue inhaler 2 puffs every 4 to 6 hours as needed for shortness of breath, chest tightness, coughing, and wheezing. Monitor frequency of use.  . Repeat spirometry at next visit. . If still having issues then consider adding Spiriva 1.52mcg to above regimen.   Other allergic rhinitis Stable with below regimen. Having some PND now.  Continue with Zyrtec 10mg  at night.  Nasal saline spray (i.e., Simply Saline) or nasal saline lavage (i.e., NeilMed) is recommended as needed and prior to medicated nasal sprays.  Continue with Flonase 2 sprays once a day.  May use azelastine 1-2 sprays per nostril twice a day for drainage.  Consider repeat testing in future.   Gastroesophageal reflux disease Stable.  Continue Nexium 40mg  daily.  Return in about 2 months (around 10/08/2019).  Meds ordered this encounter  Medications  . albuterol (PROAIR HFA)  108 (90 Base) MCG/ACT inhaler    Sig: Inhale 2 puffs into the lungs every 4 (four) hours as needed for wheezing or shortness of breath.    Dispense:  18 g    Refill:  1  . montelukast (SINGULAIR) 10 MG tablet    Sig: TAKE 1 TABLET BY MOUTH AT BEDTIME TO PREVENT COUGHING OR WHEEZING    Dispense:  30 tablet    Refill:  5  . budesonide-formoterol (SYMBICORT) 160-4.5 MCG/ACT inhaler    Sig: Inhale 2 puffs into the lungs 2 (two) times daily.    Dispense:  10.2  g    Refill:  5  . azelastine (ASTELIN) 0.1 % nasal spray    Sig: Place 1-2 sprays into both nostrils 2 (two) times daily as needed for rhinitis. Use in each nostril as directed    Dispense:  30 mL    Refill:  5   Diagnostics: Spirometry:  Tracings reviewed. Her effort: It was hard to get consistent efforts and there is a question as to whether this reflects a maximal maneuver. FVC: 3.59L FEV1: 3.05L, 103% predicted FEV1/FVC ratio: 85% Interpretation: No overt abnormalities noted given today's efforts.  Please see scanned spirometry results for details.  Medication List:  Current Outpatient Medications  Medication Sig Dispense Refill  . acetaminophen (TYLENOL) 325 MG tablet Take 650 mg by mouth every 6 (six) hours as needed.    Marland Kitchen albuterol (PROAIR HFA) 108 (90 Base) MCG/ACT inhaler Inhale 2 puffs into the lungs every 4 (four) hours as needed for wheezing or shortness of breath. 18 g 1  . albuterol (PROVENTIL) (2.5 MG/3ML) 0.083% nebulizer solution Take 3 mLs (2.5 mg total) by nebulization every 4 (four) hours as needed for wheezing or shortness of breath. 75 mL 1  . budesonide-formoterol (SYMBICORT) 160-4.5 MCG/ACT inhaler Inhale 2 puffs into the lungs 2 (two) times daily. 10.2 g 5  . cetirizine (ZYRTEC) 10 MG tablet Take 10 mg by mouth daily.    . diphenhydrAMINE (BENADRYL) 25 MG tablet Take by mouth.    . esomeprazole (NEXIUM) 40 MG capsule Take 40 mg by mouth daily at 12 noon.    . famotidine (PEPCID) 20 MG tablet Take 1 tablet twice a day to decrease reflux. 64 tablet 5  . fluticasone (FLONASE) 50 MCG/ACT nasal spray TWO SPRAYS EACH NOSTRIL ONCE A DAY FOR NASAL CONGESTION OR DRAINAGE. 16 g 5  . guaiFENesin (MUCINEX) 600 MG 12 hr tablet Take by mouth 2 (two) times daily.    Marland Kitchen lisinopril (ZESTRIL) 10 MG tablet Take 10 mg by mouth daily.     . metoprolol succinate (TOPROL-XL) 100 MG 24 hr tablet TAKE 1 TABLET BY MOUTH EVERY DAY    . montelukast (SINGULAIR) 10 MG tablet TAKE 1 TABLET  BY MOUTH AT BEDTIME TO PREVENT COUGHING OR WHEEZING 30 tablet 5  . Multiple Vitamin (MULTIVITAMIN) tablet Take 1 tablet by mouth daily.    Marland Kitchen RA SALINE NASAL SPRAY 0.65 % nasal spray USE 2 SPRAYS BY NASAL ROUTE AS NEEDED FOR CONGESTION  0  . vitamin B-12 (CYANOCOBALAMIN) 1000 MCG tablet Take 2,000 mcg by mouth daily.    Marland Kitchen zolpidem (AMBIEN) 5 MG tablet Take 5 mg by mouth at bedtime as needed for sleep.    Marland Kitchen azelastine (ASTELIN) 0.1 % nasal spray Place 1-2 sprays into both nostrils 2 (two) times daily as needed for rhinitis. Use in each nostril as directed 30 mL 5  . dextromethorphan (DELSYM) 30 MG/5ML liquid Take by  mouth as needed for cough.    . fluticasone (FLOVENT HFA) 110 MCG/ACT inhaler Inhale 2 puffs into the lungs 2 (two) times daily as needed (for asthma flares).     . meloxicam (MOBIC) 15 MG tablet Reported on 04/27/2015  0  . predniSONE (DELTASONE) 10 MG tablet Take 2 tablet twice a day for 3 days, then 2 tablets on day 4, then 1 tablet on day 5, then stop. (Patient not taking: Reported on 12/27/2018) 15 tablet 0   No current facility-administered medications for this visit.   Allergies: Allergies  Allergen Reactions  . Sulfa Antibiotics Itching, Nausea Only and Rash  . Codeine Other (See Comments)    Arm pain   . Fluoxetine Hcl Other (See Comments)    suicidal  . Oxycodone-Acetaminophen Other (See Comments)    Arm pain    I reviewed her past medical history, social history, family history, and environmental history and no significant changes have been reported from her previous visit.  Review of Systems  Constitutional: Negative for appetite change, chills, fever and unexpected weight change.  HENT: Positive for congestion, postnasal drip and rhinorrhea.   Eyes: Negative for itching.  Respiratory: Positive for cough and chest tightness. Negative for shortness of breath and wheezing.   Gastrointestinal: Negative for abdominal pain.  Skin: Negative for rash.  Neurological:  Negative for headaches.   Objective: BP (!) 128/100 (BP Location: Right Arm, Patient Position: Sitting, Cuff Size: Large)   Pulse 70   Temp 97.8 F (36.6 C) (Temporal)   Resp 19   Ht 5\' 6"  (1.676 m)   Wt (!) 310 lb 9.6 oz (140.9 kg)   SpO2 97%   BMI 50.13 kg/m  Body mass index is 50.13 kg/m. Physical Exam  Constitutional: She is oriented to person, place, and time. She appears well-developed and well-nourished.  HENT:  Head: Normocephalic and atraumatic.  Right Ear: External ear normal.  Left Ear: External ear normal.  Nose: Nose normal.  Mouth/Throat: Oropharynx is clear and moist.  Eyes: Conjunctivae and EOM are normal.  Cardiovascular: Normal rate, regular rhythm and normal heart sounds. Exam reveals no gallop and no friction rub.  No murmur heard. Pulmonary/Chest: Effort normal. She has no wheezes. She has no rales.  Decreased breath sounds.  Musculoskeletal:     Cervical back: Neck supple.  Neurological: She is alert and oriented to person, place, and time.  Skin: Skin is warm. No rash noted.  Psychiatric: She has a normal mood and affect. Her behavior is normal.  Nursing note and vitals reviewed.  Previous notes and tests were reviewed. The plan was reviewed with the patient/family, and all questions/concerned were addressed.  It was my pleasure to see Coleta today and participate in her care. Please feel free to contact me with any questions or concerns.  Sincerely,  , DO Allergy & Immunology  [facility] office: [phone] [facility] office: [phone] [place] office: [phone]

## 2019-08-08 NOTE — Assessment & Plan Note (Signed)
Stable. Continue Nexium 40mg daily

## 2019-10-12 ENCOUNTER — Ambulatory Visit: Payer: PRIVATE HEALTH INSURANCE | Admitting: Family Medicine

## 2019-10-16 ENCOUNTER — Other Ambulatory Visit: Payer: Self-pay | Admitting: Allergy

## 2019-11-05 ENCOUNTER — Other Ambulatory Visit: Payer: Self-pay | Admitting: Allergy

## 2019-11-14 ENCOUNTER — Ambulatory Visit: Payer: PRIVATE HEALTH INSURANCE | Admitting: Family Medicine

## 2019-11-28 ENCOUNTER — Other Ambulatory Visit: Payer: Self-pay | Admitting: Allergy

## 2019-12-07 ENCOUNTER — Other Ambulatory Visit: Payer: Self-pay

## 2019-12-07 ENCOUNTER — Ambulatory Visit (INDEPENDENT_AMBULATORY_CARE_PROVIDER_SITE_OTHER): Payer: PRIVATE HEALTH INSURANCE | Admitting: Family Medicine

## 2019-12-07 ENCOUNTER — Encounter: Payer: Self-pay | Admitting: Family Medicine

## 2019-12-07 VITALS — BP 122/82 | HR 65 | Temp 98.5°F | Resp 20 | Ht 66.0 in | Wt 310.0 lb

## 2019-12-07 DIAGNOSIS — R040 Epistaxis: Secondary | ICD-10-CM | POA: Diagnosis not present

## 2019-12-07 DIAGNOSIS — K219 Gastro-esophageal reflux disease without esophagitis: Secondary | ICD-10-CM | POA: Diagnosis not present

## 2019-12-07 DIAGNOSIS — J3089 Other allergic rhinitis: Secondary | ICD-10-CM

## 2019-12-07 DIAGNOSIS — J4540 Moderate persistent asthma, uncomplicated: Secondary | ICD-10-CM

## 2019-12-07 DIAGNOSIS — J302 Other seasonal allergic rhinitis: Secondary | ICD-10-CM

## 2019-12-07 DIAGNOSIS — B370 Candidal stomatitis: Secondary | ICD-10-CM

## 2019-12-07 MED ORDER — NYSTATIN 100000 UNIT/ML MT SUSP: OROMUCOSAL | 0 refills | Status: DC

## 2019-12-07 NOTE — Progress Notes (Addendum)
[address] [place] [address] Dept: [phone]  FOLLOW UP NOTE  Patient ID: Amber Steele, female    DOB: [birth year]  Age: 54 y.o. MRN: [id number] Date of Office Visit: 12/07/2019  Assessment  Chief Complaint: Asthma  HPI Amber Steele is a 54 year old female who presents to the clinic for follow-up visit.  She was last seen in this clinic on 08/08/2019 by Dr. Selena Batten for evaluation of asthma, allergic rhinitis, and reflux.  At today's visit she reports her asthma has been moderately well controlled with symptoms frequently occurring with rainy weather and humidity.  She reports symptoms including occasional shortness of breath and chest tightness in addition to and wheeze occurring about 1 to 2 days a week in the morning and resolving within a few hours.  She continues montelukast 10 mg once a day, Symbicort 160-2 puffs every morning and 2 puffs 5 or 6 evenings a week, and albuterol 1 or 2 times a week.  Allergic rhinitis is reported as moderately well controlled with symptoms including clear rhinorrhea, sneezing, occasional nasal congestion and occasional postnasal drainage.  She continues cetirizine 10 mg once a day Flonase 4-5 times a week, and azelastine if she feels as though the Flonase is not working.  She does report a " weird" feeling in her throat that began on Friday night and is described as itchy with some soreness.  She continues to use a spacer with her Symbicort inhaler as well as gargle and brush her teeth after each use.  Reflux is reported as well controlled with Nexium daily.  She does report that she has taken famotidine on occasion when she has eaten dinner late at night.  She reports that she has recently been on a vacation to the [facility] states and has experienced epistaxis daily for about 5 or 6 out of 10 days that she spent there.  She reports bleeding mostly from the right nostril that occurred 2 or 3 times a day and resolved and under 5 minutes.  She has  experienced 1 episode of epistaxis since she has returned home.  Her current medications are listed in the chart.   Drug Allergies:  Allergies  Allergen Reactions  . Sulfa Antibiotics Itching, Nausea Only and Rash  . Codeine Other (See Comments)    Arm pain   . Fluoxetine Hcl Other (See Comments)    suicidal  . Oxycodone-Acetaminophen Other (See Comments)    Arm pain     Physical Exam: BP 122/82   Pulse 65   Temp 98.5 F (36.9 C) (Oral)   Resp 20   Ht 5\' 6"  (1.676 m)   Wt (!) 310 lb (140.6 kg)   SpO2 98%   BMI 50.04 kg/m    Physical Exam Vitals reviewed.  Constitutional:      Appearance: Normal appearance.  HENT:     Head: Normocephalic and atraumatic.     Right Ear: Tympanic membrane normal.     Left Ear: Tympanic membrane normal.     Nose:     Comments: Bilateral nares slightly erythematous with clear nasal drainage.  No bleeding noted. Ears normal.  Eyes normal.    Mouth/Throat:     Pharynx: Oropharynx is clear.     Comments: White coating noted on the soft palate Eyes:     Conjunctiva/sclera: Conjunctivae normal.  Cardiovascular:     Rate and Rhythm: Normal rate and regular rhythm.     Heart sounds: Normal heart sounds. No murmur heard.  Pulmonary:     Effort: Pulmonary effort is normal.     Breath sounds: Normal breath sounds.     Comments: Lungs clear to auscultation Musculoskeletal:        General: Normal range of motion.     Cervical back: Normal range of motion and neck supple.  Skin:    General: Skin is warm and dry.  Neurological:     Mental Status: She is alert and oriented to person, place, and time.  Psychiatric:        Mood and Affect: Mood normal.        Behavior: Behavior normal.        Thought Content: Thought content normal.        Judgment: Judgment normal.     Diagnostics: FVC 3.92, FEV1 3.34.  Predicted FVC 3.76, predicted FEV1 2.97.  Spirometry indicates normal ventilatory function.  Assessment and Plan: 1. Moderate  persistent asthma without complication   2. Seasonal and perennial allergic rhinitis   3. Gastroesophageal reflux disease, unspecified whether esophagitis present   4. Epistaxis   5. Oral candidiasis     Meds ordered this encounter  Medications  . nystatin (MYCOSTATIN) 100000 UNIT/ML suspension    Sig: 5 mls 4 times a day for 7 days    Dispense:  60 mL    Refill:  0    Patient Instructions  Asthma Continue montelukast 10- mg once a day to prevent cough or wheeze Continue Smbicort 160-2 puffs twice a day with a spacer to prevent cough and wheeze.  Continue ProAir 2 puffs every 4 hours as needed for cough or wheeze or instead albuterol via nebulizer every 4 hours as needed for cough and wheeze.  You may use ProAir 5-15 minutes before exercise to prevent cough or wheeze  Oral candidiasis Begin nystatin 5 mL 4 times a day for 7 days Continue to use a spacer with your Symbicort and rinse your mouth after each use  Allergic rhinitis Continue cetirizine 10 mg once a day as needed for a runny nose Continue azelastine nasal spray 2 sprays in each nostril twice a day as needed for a runny nose Continue Flonase 2 sprays in each nostril once a day as needed for a stuffy nose. Do not use this medication if you are having a bloody nose. In the right nostril, point the applicator out toward the right ear. In the left nostril, point the applicator out toward the left ear Continue saline nasal rinses as needed for nasal symptoms. Use this before any medicated nasal sprays for best result  Epistaxis Pinch both nostrils while leaning forward for at least 5 minutes before checking to see if the bleeding has stopped. If bleeding is not controlled within 5-10 minutes apply a cotton ball soaked with oxymetazoline (Afrin) to the bleeding nostril for a few seconds.  If the problem persists or worsens a referral to ENT for further evaluation may be necessary.  Reflux Continue Nexium once a day as  previously prescribed to control reflux Continue famotidine 20 mg twice a day as needed for reflux Continue dietary and lifestyle modification as below  Call the clinic if this treatment plan is not working well for you  Follow up in 6 months or sooner if needed   Return in about 6 months (around 06/05/2020), or if symptoms worsen or fail to improve.    Thank you for the opportunity to care for this patient.  Please do not hesitate to contact me with  questions.  [name], FNP [facility]  ________________________________________________  I have provided oversight concerning Thurston Hole Amb's evaluation and treatment of this patient's health issues addressed during today's encounter.  I agree with the assessment and therapeutic plan as outlined in the note.   Signed,   R Jorene Guest, MD

## 2019-12-07 NOTE — Patient Instructions (Addendum)
Asthma Continue montelukast 10- mg once a day to prevent cough or wheeze Continue Smbicort 160-2 puffs twice a day with a spacer to prevent cough and wheeze.  Continue ProAir 2 puffs every 4 hours as needed for cough or wheeze or instead albuterol via nebulizer every 4 hours as needed for cough and wheeze.  You may use ProAir 5-15 minutes before exercise to prevent cough or wheeze  Oral candidiasis Begin nystatin 5 mL 4 times a day for 7 days Continue to use a spacer with your Symbicort and rinse your mouth after each use  Allergic rhinitis Continue cetirizine 10 mg once a day as needed for a runny nose Continue azelastine nasal spray 2 sprays in each nostril twice a day as needed for a runny nose Continue Flonase 2 sprays in each nostril once a day as needed for a stuffy nose. Do not use this medication if you are having a bloody nose. In the right nostril, point the applicator out toward the right ear. In the left nostril, point the applicator out toward the left ear Continue saline nasal rinses as needed for nasal symptoms. Use this before any medicated nasal sprays for best result  Epistaxis Pinch both nostrils while leaning forward for at least 5 minutes before checking to see if the bleeding has stopped. If bleeding is not controlled within 5-10 minutes apply a cotton ball soaked with oxymetazoline (Afrin) to the bleeding nostril for a few seconds.  If the problem persists or worsens a referral to ENT for further evaluation may be necessary.  Reflux Continue Nexium once a day as previously prescribed to control reflux Continue famotidine 20 mg twice a day as needed for reflux Continue dietary and lifestyle modification as below  Call the clinic if this treatment plan is not working well for you  Follow up in 6 months or sooner if needed   Lifestyle Changes for Controlling GERD When you have GERD, stomach acid feels as if it's backing up toward your mouth. Whether or not you take  medication to control your GERD, your symptoms can often be improved with lifestyle changes.   Raise Your Head  Reflux is more likely to strike when you're lying down flat, because stomach fluid can  flow backward more easily. Raising the head of your bed 4-6 inches can help. To do this:  Slide blocks or books under the legs at the head of your bed. Or, place a wedge under  the mattress. Many foam stores can make a suitable wedge for you. The wedge  should run from your waist to the top of your head.  Don't just prop your head on several pillows. This increases pressure on your  stomach. It can make GERD worse.  Watch Your Eating Habits Certain foods may increase the acid in your stomach or relax the lower esophageal sphincter, making GERD more likely. It's best to avoid the following:  Coffee, tea, and carbonated drinks (with and without caffeine)  Fatty, fried, or spicy food  Mint, chocolate, onions, and tomatoes  Any other foods that seem to irritate your stomach or cause you pain  Relieve the Pressure  Eat smaller meals, even if you have to eat more often.  Don't lie down right after you eat. Wait a few hours for your stomach to empty.  Avoid tight belts and tight-fitting clothes.  Lose excess weight.  Tobacco and Alcohol  Avoid smoking tobacco and drinking alcohol. They can make GERD symptoms worse.

## 2019-12-08 ENCOUNTER — Other Ambulatory Visit: Payer: Self-pay | Admitting: Family Medicine

## 2019-12-08 ENCOUNTER — Encounter: Payer: Self-pay | Admitting: Family Medicine

## 2019-12-09 NOTE — Telephone Encounter (Signed)
Can you please send an additional 100 mL of nystatin swish and swallow for this patinet. Please let her know this additional dose will be at the pharmacy. Thank you

## 2019-12-11 ENCOUNTER — Other Ambulatory Visit: Payer: Self-pay | Admitting: Family Medicine

## 2019-12-12 ENCOUNTER — Ambulatory Visit: Payer: Self-pay | Admitting: *Deleted

## 2019-12-12 ENCOUNTER — Telehealth: Payer: Self-pay | Admitting: *Deleted

## 2019-12-12 MED ORDER — NYSTATIN 100000 UNIT/ML MT SUSP: 5.0000 mL | Freq: Four times a day (QID) | OROMUCOSAL | 0 refills | Status: AC

## 2019-12-12 MED ORDER — NYSTATIN 100000 UNIT/ML MT SUSP: 5.0000 mL | Freq: Four times a day (QID) | OROMUCOSAL | 0 refills | Status: DC

## 2019-12-12 NOTE — Addendum Note (Signed)
Addended by: Maryjean Morn D on: 12/12/2019 05:07 PM   Modules accepted: Orders

## 2019-12-12 NOTE — Telephone Encounter (Signed)
Lets just start from the start again. Can you please order Nystatin swish and swallow 5 mL every 6 hours for 7 days for a total of 140 mL. Just to account for any spilling can you please order 175 mL? Thank you

## 2019-12-12 NOTE — Telephone Encounter (Signed)
rx sent to [facility]

## 2019-12-12 NOTE — Telephone Encounter (Signed)
My pharmacy has still not received this order.  If this changes I will advise you.  I will take my last dose this evening (Saturday).  If it is filled Monday I will miss two full days.  Please advise as to what I need to do.  Thank you, Amber Steele  Thurston Hole please advise. I had spoken with a pharmacist on Friday and gave the verbal order so I am not sure why they did not fill it.

## 2019-12-12 NOTE — Addendum Note (Signed)
Addended by: Maryjean Morn D on: 12/12/2019 05:05 PM   Modules accepted: Orders

## 2019-12-12 NOTE — Telephone Encounter (Signed)
PT called stating she did not get the additional nystatin. Advised per logan, a verbal order was called in to the pharmacy on Friday.Per Thurston Hole, she is to restart nystatin since she has missed a few days due to incomplete prescription amount.

## 2019-12-23 ENCOUNTER — Telehealth: Payer: Self-pay | Admitting: Family Medicine

## 2019-12-23 NOTE — Telephone Encounter (Signed)
Spoke with Amber Steele she said that toward the end of her nystatin medication her throat started to become sore and red and then once finishing the medication it became worse and she developed the white patches. She states her right tonsil looks like a white cauliflower.

## 2019-12-23 NOTE — Telephone Encounter (Signed)
Can you please ask her if they resolved and reappeared? Or if the spots did not resolve? Thank you

## 2019-12-23 NOTE — Telephone Encounter (Signed)
Pt states she is has white spots in her throat and on her tongue. Similar to last thrush episode.

## 2019-12-23 NOTE — Telephone Encounter (Signed)
Throat began hurting on Monday while still taking nystatin suspension. She reports her right tonsil has white patches that looks like speckley stringy cauliflower beginning 1 day ago with mild swelling. Feels like throat is dry. Denies fever. Not painful to swallow but hesitant and "odd" feeling. Controlling her own secretions without difficulty. Gargling salt water with no improvement in symptoms. Patient advised to go to her PCP for a throat culture and to have a provider visualize her throat. She agrees and will contact me with any further questions. We may need to change her current inhaler regimen to exclude ICS inhaler. She will keep [place] updated.

## 2020-01-31 ENCOUNTER — Other Ambulatory Visit: Payer: Self-pay | Admitting: Allergy

## 2020-06-05 NOTE — Progress Notes (Signed)
Follow Up Note  RE: Amber Steele MRN: [id number] DOB: Jul 18, 1965 Date of Office Visit: 06/06/2020  Referring provider: Lelon Huh [name]* Primary care provider: Dellinger, [name]., MD  Chief Complaint: Asthma  History of Present Illness: I had the pleasure of seeing Amber Steele for a follow up visit at the [facility] on 06/06/2020. She is a 55 y.o. female, who is being followed for asthma, allergic rhinitis, epistaxis and reflux. Her previous allergy office visit was on 12/07/2019 with [name], FNP. Today is a regular follow up visit.  Asthma ACT score 16.  Patient stopped Symbicort 3 weeks ago due to issues with her vocal cords and possibly thrush.  She noted some whitish spots on the back of her throat - which looked like cauliflower. This was going on for about 6 months. She was treated for thrush which helped but then the white spots returned.  She did use the inhaler with spacer and gargled afterwards.   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 Patient is planning on going to vocal cord therapy. She did have one course of prednisone for the vocal cords.   She did use Symbicort a few times within the past 3 weeks since she stopped it and had to use albuterol a few times with some benefit - noted chest tightness, slight coughing, wheezing at times She did notice that the white part improved.  Denies any ER/urgent care visits since the last visit.  Still taking montelukast daily.  Allergic rhinitis Uses azelastine as needed and Flonase daily with good benefit. Takes zyrtec daily.  No nosebleeds.  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   Reflux Takes famotidine 20mg  at night. No daily PPIs.  Assessment and Plan: Amber Steele is a 55 y.o. female with: Not well controlled moderate persistent asthma Stopped Symbicort 3 weeks ago due to concerns if it was contributing to her vocal cord issues and possibly thrush. Noticing some  increased symptoms. In the past had multiple courses of prednisone due to asthma exacerbations.   ACT score 16.  Today's spirometry was normal.  Daily controller medication(s):START Alvesco 40 1 puff twice a day with spacer and rinse mouth afterwards.  May take up to 2 puffs BID. Alvesco is a prodrug and usually very well tolerated. If patient still has issues with side effects will consider adding on biologics next.  ? Continue montelukast (Singulair) 10mg  daily at night.   May use albuterol rescue inhaler 2 puffs every 4 to 6 hours as needed for shortness of breath, chest tightness, coughing, and wheezing. May use albuterol rescue inhaler 2 puffs 5 to 15 minutes prior to strenuous physical activities. Monitor frequency of use.   Get spirometry at next visit.   Other allergic rhinitis Stable with below regimen. Uses azelastine with good benefit. Anosmia since January 2022 - no formal COVID-19 diagnosis.   Continue with Zyrtec 10mg  at night.  Nasal saline spray (i.e., Simply Saline) or nasal saline lavage (i.e., NeilMed) is recommended as needed and prior to medicated nasal sprays.  May use Flonase (fluticasone) nasal spray 1 spray per nostril twice a day as needed for nasal congestion.   May use azelastine nasal spray 1-2 sprays per nostril twice a day as needed for runny nose/drainage.  Consider repeat testing in future.   Gastroesophageal reflux disease Stable.  May use Nexium and famotidine as needed.   Continue dietary and lifestyle modifications.   Return in about 2 months (around  08/06/2020).  Meds ordered this encounter  Medications   albuterol (PROVENTIL) (2.5 MG/3ML) 0.083% nebulizer solution    Sig: Take 3 mLs (2.5 mg total) by nebulization every 4 (four) hours as needed for wheezing or shortness of breath.    Dispense:  75 mL    Refill:  1   ciclesonide (ALVESCO) 80 MCG/ACT inhaler    Sig: Inhale 2 puffs into the lungs 2 (two) times daily.    Dispense:  1  each    Refill:  5   Lab Orders  No laboratory test(s) ordered today    Diagnostics: Spirometry:  Tracings reviewed. Her effort: Good reproducible efforts. FVC: 3.87L FEV1: 3.28L, 112% predicted FEV1/FVC ratio: 85% Interpretation: Spirometry consistent with normal pattern.  Please see scanned spirometry results for details.  Medication List:  Current Outpatient Medications  Medication Sig Dispense Refill   acetaminophen (TYLENOL) 325 MG tablet Take 650 mg by mouth every 6 (six) hours as needed.     albuterol (PROVENTIL) (2.5 MG/3ML) 0.083% nebulizer solution Take 3 mLs (2.5 mg total) by nebulization every 4 (four) hours as needed for wheezing or shortness of breath. 75 mL 1   albuterol (VENTOLIN HFA) 108 (90 Base) MCG/ACT inhaler INHALE 2 PUFFS INTO THE LUNGS EVERY 4 HOURS AS NEEDED FOR WHEEZING OR SHORTNESS OF BREATH 8.5 g 0   azelastine (ASTELIN) 0.1 % nasal spray Place 1-2 sprays into both nostrils 2 (two) times daily as needed for rhinitis. Use in each nostril as directed 30 mL 5   cetirizine (ZYRTEC) 10 MG tablet Take 10 mg by mouth daily.     ciclesonide (ALVESCO) 80 MCG/ACT inhaler Inhale 2 puffs into the lungs 2 (two) times daily. 1 each 5   dextromethorphan (DELSYM) 30 MG/5ML liquid Take by mouth as needed for cough.     diphenhydrAMINE (BENADRYL) 25 MG tablet Take by mouth.     famotidine (PEPCID) 20 MG tablet Take 1 tablet twice a day to decrease reflux. 64 tablet 5   guaiFENesin (MUCINEX) 600 MG 12 hr tablet Take by mouth 2 (two) times daily.     lisinopril (ZESTRIL) 10 MG tablet Take 10 mg by mouth daily.      metoprolol succinate (TOPROL-XL) 100 MG 24 hr tablet TAKE 1 TABLET BY MOUTH EVERY DAY     montelukast (SINGULAIR) 10 MG tablet TAKE 1 TABLET BY MOUTH AT BEDTIME TO PREVENT COUGHING OR WHEEZING 30 tablet 4   Multiple Vitamin (MULTIVITAMIN) tablet Take 1 tablet by mouth daily.     RA SALINE NASAL SPRAY 0.65 % nasal spray USE 2 SPRAYS BY NASAL ROUTE AS  NEEDED FOR CONGESTION  0   vitamin B-12 (CYANOCOBALAMIN) 1000 MCG tablet Take 2,000 mcg by mouth daily.     zolpidem (AMBIEN) 5 MG tablet Take 5 mg by mouth at bedtime as needed for sleep.     esomeprazole (NEXIUM) 40 MG capsule Take 40 mg by mouth daily at 12 noon. (Patient not taking: Reported on 06/06/2020)     fluticasone (FLONASE) 50 MCG/ACT nasal spray TWO SPRAYS EACH NOSTRIL ONCE A DAY FOR NASAL CONGESTION OR DRAINAGE. (Patient not taking: Reported on 06/06/2020) 16 g 5   No current facility-administered medications for this visit.   Allergies: Allergies  Allergen Reactions   Sulfa Antibiotics Itching, Nausea Only and Rash   Codeine Other (See Comments)    Arm pain    Fluoxetine Hcl Other (See Comments)    suicidal   Oxycodone-Acetaminophen Other (See Comments)  Arm pain    I reviewed her past medical history, social history, family history, and environmental history and no significant changes have been reported from her previous visit.  Review of Systems  Constitutional: Negative for appetite change, chills, fever and unexpected weight change.  HENT: Negative for congestion and postnasal drip.   Eyes: Negative for itching.  Respiratory: Positive for cough, chest tightness and wheezing. Negative for shortness of breath.   Gastrointestinal: Negative for abdominal pain.  Skin: Negative for rash.  Neurological: Negative for headaches.   Objective: BP 110/82    Pulse 62    Temp 97.8 F (36.6 C) (Temporal)    Resp 16    SpO2 99%  There is no height or weight on file to calculate BMI. Physical Exam Vitals and nursing note reviewed.  Constitutional:      Appearance: She is well-developed.  HENT:     Head: Normocephalic and atraumatic.     Right Ear: External ear normal.     Left Ear: External ear normal.     Nose: Nose normal.  Eyes:     Conjunctiva/sclera: Conjunctivae normal.  Cardiovascular:     Rate and Rhythm: Normal rate and regular rhythm.     Heart  sounds: Normal heart sounds. No murmur heard. No friction rub. No gallop.   Pulmonary:     Effort: Pulmonary effort is normal.     Breath sounds: No wheezing or rales.  Musculoskeletal:     Cervical back: Neck supple.  Skin:    General: Skin is warm.     Findings: No rash.  Neurological:     Mental Status: She is alert and oriented to person, place, and time.  Psychiatric:        Behavior: Behavior normal.    Previous notes and tests were reviewed. The plan was reviewed with the patient/family, and all questions/concerned were addressed.  It was my pleasure to see Amber Steele today and participate in her care. Please feel free to contact me with any questions or concerns.  Sincerely,  Wyline Mood, DO Allergy & Immunology  [facility] office: [phone] [facility] office: [phone]

## 2020-06-06 ENCOUNTER — Other Ambulatory Visit: Payer: Self-pay

## 2020-06-06 ENCOUNTER — Encounter: Payer: Self-pay | Admitting: Allergy

## 2020-06-06 ENCOUNTER — Ambulatory Visit: Payer: PRIVATE HEALTH INSURANCE | Admitting: Allergy and Immunology

## 2020-06-06 ENCOUNTER — Ambulatory Visit (INDEPENDENT_AMBULATORY_CARE_PROVIDER_SITE_OTHER): Payer: PRIVATE HEALTH INSURANCE | Admitting: Allergy

## 2020-06-06 VITALS — BP 110/82 | HR 62 | Temp 97.8°F | Resp 16

## 2020-06-06 DIAGNOSIS — J3089 Other allergic rhinitis: Secondary | ICD-10-CM | POA: Diagnosis not present

## 2020-06-06 DIAGNOSIS — K219 Gastro-esophageal reflux disease without esophagitis: Secondary | ICD-10-CM | POA: Diagnosis not present

## 2020-06-06 DIAGNOSIS — J4540 Moderate persistent asthma, uncomplicated: Secondary | ICD-10-CM | POA: Diagnosis not present

## 2020-06-06 MED ORDER — ALBUTEROL SULFATE (2.5 MG/3ML) 0.083% IN NEBU: 2.5000 mg | INHALATION_SOLUTION | RESPIRATORY_TRACT | 1 refills | Status: DC | PRN

## 2020-06-06 MED ORDER — ALVESCO 80 MCG/ACT IN AERS: 2.0000 | INHALATION_SPRAY | Freq: Two times a day (BID) | RESPIRATORY_TRACT | 5 refills | Status: DC

## 2020-06-06 NOTE — Assessment & Plan Note (Signed)
Stable with below regimen. Uses azelastine with good benefit. Anosmia since January 2022 - no formal COVID-19 diagnosis.   Continue with Zyrtec 10mg  at night.  Nasal saline spray (i.e., Simply Saline) or nasal saline lavage (i.e., NeilMed) is recommended as needed and prior to medicated nasal sprays.  May use Flonase (fluticasone) nasal spray 1 spray per nostril twice a day as needed for nasal congestion.   May use azelastine nasal spray 1-2 sprays per nostril twice a day as needed for runny nose/drainage.  Consider repeat testing in future.

## 2020-06-06 NOTE — Patient Instructions (Addendum)
Moderate persistent asthma  Daily controller medication(s):START Alvesco 1 puff twice a day with spacer and rinse mouth afterwards.  And if you notice worsening symptoms with your asthma you make take it 2 puffs twice a day. ? Continue montelukast (singulair) 10mg  daily at night.  . May use albuterol rescue inhaler 2 puffs every 4 to 6 hours as needed for shortness of breath, chest tightness, coughing, and wheezing. May use albuterol rescue inhaler 2 puffs 5 to 15 minutes prior to strenuous physical activities. Monitor frequency of use.  . Asthma control goals:  o Full participation in all desired activities (may need albuterol before activity) o Albuterol use two times or less a week on average (not counting use with activity) o Cough interfering with sleep two times or less a month o Oral steroids no more than once a year o No hospitalizations  Other allergic rhinitis  Continue with Zyrtec 10mg  at night.  Nasal saline spray (i.e., Simply Saline) or nasal saline lavage (i.e., NeilMed) is recommended as needed and prior to medicated nasal sprays.  May use Flonase (fluticasone) nasal spray 1 spray per nostril twice a day as needed for nasal congestion.   May use azelastine nasal spray 1-2 sprays per nostril twice a day as needed for runny nose/drainage.  Consider repeat testing in future.   Gastroesophageal reflux disease  May use Nexium and famotidine as needed.   Continue dietary and lifestyle modifications.   Follow up in 2 months or sooner if needed.    Gastroesophageal Reflux Disease, Adult  Gastroesophageal reflux (GER) happens when acid from the stomach flows up into the tube that connects the mouth and the stomach (esophagus). Normally, food travels down the esophagus and stays in the stomach to be digested. With GER, food and stomach acid sometimes move back up into the esophagus. You may have a disease called gastroesophageal reflux disease (GERD) if the  reflux:  Happens often.  Causes frequent or very bad symptoms.  Causes problems such as damage to the esophagus. When this happens, the esophagus becomes sore and swollen. Over time, GERD can make small holes (ulcers) in the lining of the esophagus. What are the causes? This condition is caused by a problem with the muscle between the esophagus and the stomach. When this muscle is weak or not normal, it does not close properly to keep food and acid from coming back up from the stomach. The muscle can be weak because of:  Tobacco use.  Pregnancy.  Having a certain type of hernia (hiatal hernia).  Alcohol use.  Certain foods and drinks, such as coffee, chocolate, onions, and peppermint. What increases the risk?  Being overweight.  Having a disease that affects your connective tissue.  Taking NSAIDs, such a ibuprofen. What are the signs or symptoms?  Heartburn.  Difficult or painful swallowing.  The feeling of having a lump in the throat.  A bitter taste in the mouth.  Bad breath.  Having a lot of saliva.  Having an upset or bloated stomach.  Burping.  Chest pain. Different conditions can cause chest pain. Make sure you see your doctor if you have chest pain.  Shortness of breath or wheezing.  A long-term cough or a cough at night.  Wearing away of the surface of teeth (tooth enamel).  Weight loss. How is this treated?  Making changes to your diet.  Taking medicine.  Having surgery. Treatment will depend on how bad your symptoms are. Follow these instructions at home:  Eating and drinking  Follow a diet as told by your doctor. You may need to avoid foods and drinks such as: ? Coffee and tea, with or without caffeine. ? Drinks that contain alcohol. ? Energy drinks and sports drinks. ? Bubbly (carbonated) drinks or sodas. ? Chocolate and cocoa. ? Peppermint and mint flavorings. ? Garlic and onions. ? Horseradish. ? Spicy and acidic foods. These  include peppers, chili powder, curry powder, vinegar, hot sauces, and BBQ sauce. ? Citrus fruit juices and citrus fruits, such as oranges, lemons, and limes. ? Tomato-based foods. These include red sauce, chili, salsa, and pizza with red sauce. ? Fried and fatty foods. These include donuts, french fries, potato chips, and high-fat dressings. ? High-fat meats. These include hot dogs, rib eye steak, sausage, ham, and bacon. ? High-fat dairy items, such as whole milk, butter, and cream cheese.  Eat small meals often. Avoid eating large meals.  Avoid drinking large amounts of liquid with your meals.  Avoid eating meals during the 2-3 hours before bedtime.  Avoid lying down right after you eat.  Do not exercise right after you eat.   Lifestyle  Do not smoke or use any products that contain nicotine or tobacco. If you need help quitting, ask your doctor.  Try to lower your stress. If you need help doing this, ask your doctor.  If you are overweight, lose an amount of weight that is healthy for you. Ask your doctor about a safe weight loss goal.   General instructions  Pay attention to any changes in your symptoms.  Take over-the-counter and prescription medicines only as told by your doctor.  Do not take aspirin, ibuprofen, or other NSAIDs unless your doctor says it is okay.  Wear loose clothes. Do not wear anything tight around your waist.  Raise (elevate) the head of your bed about 6 inches (15 cm). You may need to use a wedge to do this.  Avoid bending over if this makes your symptoms worse.  Keep all follow-up visits. Contact a doctor if:  You have new symptoms.  You lose weight and you do not know why.  You have trouble swallowing or it hurts to swallow.  You have wheezing or a cough that keeps happening.  You have a hoarse voice.  Your symptoms do not get better with treatment. Get help right away if:  You have sudden pain in your arms, neck, jaw, teeth, or  back.  You suddenly feel sweaty, dizzy, or light-headed.  You have chest pain or shortness of breath.  You vomit and the vomit is green, yellow, or black, or it looks like blood or coffee grounds.  You faint.  Your poop (stool) is red, bloody, or black.  You cannot swallow, drink, or eat. These symptoms may represent a serious problem that is an emergency. Do not wait to see if the symptoms will go away. Get medical help right away. Call your local emergency services (911 in the [place] Do not drive yourself to the hospital. Summary  If a person has gastroesophageal reflux disease (GERD), food and stomach acid move back up into the esophagus and cause symptoms or problems such as damage to the esophagus.  Treatment will depend on how bad your symptoms are.  Follow a diet as told by your doctor.  Take all medicines only as told by your doctor. This information is not intended to replace advice given to you by your health care provider. Make sure you discuss any  questions you have with your health care provider. Document Revised: 09/12/2019 Document Reviewed: 09/12/2019 Elsevier Patient Education  2021 [employer].

## 2020-06-06 NOTE — Assessment & Plan Note (Signed)
Stopped Symbicort 3 weeks ago due to concerns if it was contributing to her vocal cord issues and possibly thrush. Noticing some increased symptoms. In the past had multiple courses of prednisone due to asthma exacerbations.   ACT score 16.  Today's spirometry was normal.  Daily controller medication(s):START Alvesco 1 puff twice a day with spacer and rinse mouth afterwards.  May take up to 2 puffs BID. Alvesco is a prodrug and usually very well tolerated. If patient still has issues with side effects will consider adding on biologics next.  ? Continue montelukast (Singulair) 10mg  daily at night.  . May use albuterol rescue inhaler 2 puffs every 4 to 6 hours as needed for shortness of breath, chest tightness, coughing, and wheezing. May use albuterol rescue inhaler 2 puffs 5 to 15 minutes prior to strenuous physical activities. Monitor frequency of use.  . Get spirometry at next visit.

## 2020-06-06 NOTE — Assessment & Plan Note (Signed)
Stable.  May use Nexium and famotidine as needed.   Continue dietary and lifestyle modifications.

## 2020-07-15 ENCOUNTER — Other Ambulatory Visit: Payer: Self-pay | Admitting: Family Medicine

## 2020-09-12 ENCOUNTER — Ambulatory Visit: Payer: PRIVATE HEALTH INSURANCE | Admitting: Allergy

## 2020-10-11 NOTE — Progress Notes (Signed)
Follow Up Note  RE: Amber Steele MRN: [id number] DOB: [birth year] Date of Office Visit: 10/12/2020  Referring provider: Lelon Huh [name]* Primary care provider: Dellinger, [name]., MD  Chief Complaint: Asthma and Allergic Rhinitis   History of Present Illness: I had the pleasure of seeing Amber Steele for a follow up visit at the [facility] on 10/12/2020. She is a 55 y.o. female, who is being followed for asthma, allergic rhinitis and GERD. Her previous allergy office visit was on 06/06/2020 with Dr. Selena Batten. Today is a regular follow up visit.  Asthma  Currently on Alvesco 1 puff twice a day with good benefit. Sometimes forgets to take it and notices some mild symptoms.  Patient is following with ENT for her vocal cords and she has not noticed the throat soreness and white patches with the Alvesco. She still sometimes has a few spots on her tonsils that go away with some salt water gargling.   Patient states that the Symbicort seemed to be "stronger" for her lungs.  Still taking Singulair 10mg  daily.  Takes albuterol a few times per month with good benefit.   Denies any ER/urgent care visits or prednisone use since the last visit.  Other allergic rhinitis Still taking zyrtec 10mg  daily at night. Using Flonase and azelastine as needed with good benefit. No nosebleeds.   Gastroesophageal reflux disease Patient has not been taking any of her reflux medications as ENT did not see any reflux signs.    Still has loss of taste and smell 6+ months now - some previous exposures to Covid-19 infection.   Assessment and Plan: Amber Steele is a 56 y.o. female with: Moderate persistent asthma without complication Less side effects from Alvesco than Symbicort but it doesn't seem to be as "strong" for her lungs.  ACT score 19. Today's spirometry was normal - slightly worse than previous. Daily controller medication(s): try to increase Alvesco to 1 puff twice a  day with spacer and rinse mouth afterwards. Continue montelukast (Singulair) 10mg  daily at night.  May use albuterol rescue inhaler 2 puffs every 4 to 6 hours as needed for shortness of breath, chest tightness, coughing, and wheezing. May use albuterol rescue inhaler 2 puffs 5 to 15 minutes prior to strenuous physical activities. Monitor frequency of use.  Get spirometry at next visit.  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  Interim history - stable.  Continue with Zyrtec 10mg  at night. Nasal saline spray (i.e., Simply Saline) or nasal saline lavage (i.e., NeilMed) is recommended as needed and prior to medicated nasal sprays. You can use both nasal sprays at the same time if needed. Use Flonase (fluticasone) nasal spray 1 spray per nostril twice a day as needed for nasal congestion.  Use azelastine nasal spray 1-2 sprays per nostril twice a day as needed for runny nose/drainage. Consider repeat testing in future - hold zyrtec and azelastine nasal spray for 3 days before.  Gastroesophageal reflux disease ENT did not see any reflux signs and has not been using medications. May use Nexium and famotidine as needed.  Continue dietary and lifestyle modifications.   Return in about 3 months (around 01/12/2021) for Skin testing.  Meds ordered this encounter  Medications   ciclesonide (ALVESCO) 160 MCG/ACT inhaler    Sig: Inhale 2 puffs into the lungs 2 (two) times daily. with spacer and rinse mouth afterwards.    Dispense:  1 each  Refill:  5    Lab Orders  No laboratory test(s) ordered today    Diagnostics: Spirometry:  Tracings reviewed. Her effort: Good reproducible efforts. FVC: 3.71L FEV1: 3.01L, 106% predicted FEV1/FVC ratio: 81% Interpretation: Spirometry consistent with normal pattern.  Please see scanned spirometry results for details.  Medication List:  Current Outpatient Medications  Medication Sig Dispense  Refill   acetaminophen (TYLENOL) 325 MG tablet Take 650 mg by mouth every 6 (six) hours as needed.     albuterol (PROVENTIL) (2.5 MG/3ML) 0.083% nebulizer solution Take 3 mLs (2.5 mg total) by nebulization every 4 (four) hours as needed for wheezing or shortness of breath. 75 mL 1   albuterol (VENTOLIN HFA) 108 (90 Base) MCG/ACT inhaler INHALE 2 PUFFS INTO THE LUNGS EVERY 4 HOURS AS NEEDED FOR WHEEZING OR SHORTNESS OF BREATH 8.5 g 0   azelastine (ASTELIN) 0.1 % nasal spray Place 1-2 sprays into both nostrils 2 (two) times daily as needed for rhinitis. Use in each nostril as directed 30 mL 5   cetirizine (ZYRTEC) 10 MG tablet Take 10 mg by mouth daily.     ciclesonide (ALVESCO) 160 MCG/ACT inhaler Inhale 2 puffs into the lungs 2 (two) times daily. with spacer and rinse mouth afterwards. 1 each 5   fluticasone (FLONASE) 50 MCG/ACT nasal spray TWO SPRAYS EACH NOSTRIL ONCE A DAY FOR NASAL CONGESTION OR DRAINAGE. 16 g 5   lisinopril (ZESTRIL) 10 MG tablet Take 10 mg by mouth daily.      metoprolol succinate (TOPROL-XL) 100 MG 24 hr tablet TAKE 1 TABLET BY MOUTH EVERY DAY     montelukast (SINGULAIR) 10 MG tablet TAKE 1 TABLET BY MOUTH AT BEDTIME TO PREVENT COUGHING OR WHEEZING 90 tablet 1   Multiple Vitamin (MULTIVITAMIN) tablet Take 1 tablet by mouth daily.     RA SALINE NASAL SPRAY 0.65 % nasal spray USE 2 SPRAYS BY NASAL ROUTE AS NEEDED FOR CONGESTION  0   vitamin B-12 (CYANOCOBALAMIN) 1000 MCG tablet Take 2,000 mcg by mouth daily.     zolpidem (AMBIEN) 5 MG tablet Take 5 mg by mouth at bedtime as needed for sleep.     No current facility-administered medications for this visit.   Allergies: Allergies  Allergen Reactions   Sulfa Antibiotics Itching, Nausea Only and Rash   Codeine Other (See Comments)    Arm pain    Fluoxetine Hcl Other (See Comments)    suicidal   Oxycodone-Acetaminophen Other (See Comments)    Arm pain    I reviewed her past medical history, social history, family  history, and environmental history and no significant changes have been reported from her previous visit.  Review of Systems  Constitutional:  Negative for appetite change, chills, fever and unexpected weight change.  HENT:  Positive for congestion and postnasal drip.   Eyes:  Negative for itching.  Respiratory:  Negative for cough, chest tightness, shortness of breath and wheezing.   Gastrointestinal:  Negative for abdominal pain.  Skin:  Negative for rash.  Neurological:  Negative for headaches.   Objective: BP 122/72   Pulse 62   Resp 18   SpO2 97%  There is no height or weight on file to calculate BMI. Physical Exam Vitals and nursing note reviewed.  Constitutional:      Appearance: She is well-developed.  HENT:     Head: Normocephalic and atraumatic.     Right Ear: Tympanic membrane and external ear normal.     Left Ear: Tympanic membrane and  external ear normal.     Nose: Nose normal.  Eyes:     Conjunctiva/sclera: Conjunctivae normal.  Cardiovascular:     Rate and Rhythm: Normal rate and regular rhythm.     Heart sounds: Normal heart sounds. No murmur heard.   No friction rub. No gallop.  Pulmonary:     Effort: Pulmonary effort is normal.     Breath sounds: No wheezing or rales.  Musculoskeletal:     Cervical back: Neck supple.  Skin:    General: Skin is warm.     Findings: No rash.  Neurological:     Mental Status: She is alert and oriented to person, place, and time.  Psychiatric:        Behavior: Behavior normal.  Previous notes and tests were reviewed. The plan was reviewed with the patient/family, and all questions/concerned were addressed.  It was my pleasure to see Amber Steele today and participate in her care. Please feel free to contact me with any questions or concerns.  Sincerely,  Wyline Mood, DO Allergy & Immunology  [facility] office: [phone] [facility] office: [phone]

## 2020-10-12 ENCOUNTER — Other Ambulatory Visit: Payer: Self-pay

## 2020-10-12 ENCOUNTER — Encounter: Payer: Self-pay | Admitting: Allergy

## 2020-10-12 ENCOUNTER — Ambulatory Visit (INDEPENDENT_AMBULATORY_CARE_PROVIDER_SITE_OTHER): Payer: PRIVATE HEALTH INSURANCE | Admitting: Allergy

## 2020-10-12 VITALS — BP 122/72 | HR 62 | Resp 18

## 2020-10-12 DIAGNOSIS — K219 Gastro-esophageal reflux disease without esophagitis: Secondary | ICD-10-CM | POA: Diagnosis not present

## 2020-10-12 DIAGNOSIS — J3089 Other allergic rhinitis: Secondary | ICD-10-CM

## 2020-10-12 DIAGNOSIS — J4540 Moderate persistent asthma, uncomplicated: Secondary | ICD-10-CM

## 2020-10-12 MED ORDER — ALVESCO 160 MCG/ACT IN AERS: 2.0000 | INHALATION_SPRAY | Freq: Two times a day (BID) | RESPIRATORY_TRACT | 5 refills | Status: DC

## 2020-10-12 NOTE — Assessment & Plan Note (Signed)
ENT did not see any reflux signs and has not been using medications.  May use Nexium and famotidine as needed.   Continue dietary and lifestyle modifications.

## 2020-10-12 NOTE — Assessment & Plan Note (Signed)
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  Interim history - stable.   Continue with Zyrtec 10mg  at night.  Nasal saline spray (i.e., Simply Saline) or nasal saline lavage (i.e., NeilMed) is recommended as needed and prior to medicated nasal sprays.  You can use both nasal sprays at the same time if needed.  Use Flonase (fluticasone) nasal spray 1 spray per nostril twice a day as needed for nasal congestion.   Use azelastine nasal spray 1-2 sprays per nostril twice a day as needed for runny nose/drainage.  Consider repeat testing in future - hold zyrtec and azelastine nasal spray for 3 days before.

## 2020-10-12 NOTE — Patient Instructions (Addendum)
Moderate persistent asthma Daily controller medication(s): try to increase Alvesco to 1 puff twice a day with spacer and rinse mouth afterwards. Continue montelukast (singulair) 10mg  daily at night.  May use albuterol rescue inhaler 2 puffs every 4 to 6 hours as needed for shortness of breath, chest tightness, coughing, and wheezing. May use albuterol rescue inhaler 2 puffs 5 to 15 minutes prior to strenuous physical activities. Monitor frequency of use.  Asthma control goals:  Full participation in all desired activities (may need albuterol before activity) Albuterol use two times or less a week on average (not counting use with activity) Cough interfering with sleep two times or less a month Oral steroids no more than once a year No hospitalizations   Other allergic rhinitis Continue with Zyrtec 10mg  at night. Nasal saline spray (i.e., Simply Saline) or nasal saline lavage (i.e., NeilMed) is recommended as needed and prior to medicated nasal sprays. You can use both nasal sprays at the same time if needed. Use Flonase (fluticasone) nasal spray 1 spray per nostril twice a day as needed for nasal congestion.  Use azelastine nasal spray 1-2 sprays per nostril twice a day as needed for runny nose/drainage. Consider repeat testing in future - hold zyrtec and azelastine nasal spray for 3 days before   Gastroesophageal reflux disease May use Nexium and famotidine as needed.  Continue dietary and lifestyle modifications.   Follow up in 3 months or sooner if needed.  Follow up with ENT as scheduled.

## 2020-10-12 NOTE — Assessment & Plan Note (Signed)
Less side effects from Alvesco than Symbicort but it doesn't seem to be as "strong" for her lungs.   ACT score 19.  Today's spirometry was normal - slightly worse than previous.  Daily controller medication(s): try to increase Alvesco to 1 puff twice a day with spacer and rinse mouth afterwards. ? Continue montelukast (Singulair) 10mg  daily at night.  ? May use albuterol rescue inhaler 2 puffs every 4 to 6 hours as needed for shortness of breath, chest tightness, coughing, and wheezing. May use albuterol rescue inhaler 2 puffs 5 to 15 minutes prior to strenuous physical activities. Monitor frequency of use.   Get spirometry at next visit.

## 2020-10-30 ENCOUNTER — Other Ambulatory Visit: Payer: Self-pay | Admitting: Allergy

## 2020-11-03 IMAGING — DX CHEST - 2 VIEW
2 series · 2 of 2 positions shown · non-contrast
Comparison: None.

CLINICAL DATA: Right-sided rib pain

EXAM:
CHEST - 2 VIEW

[chest pa]
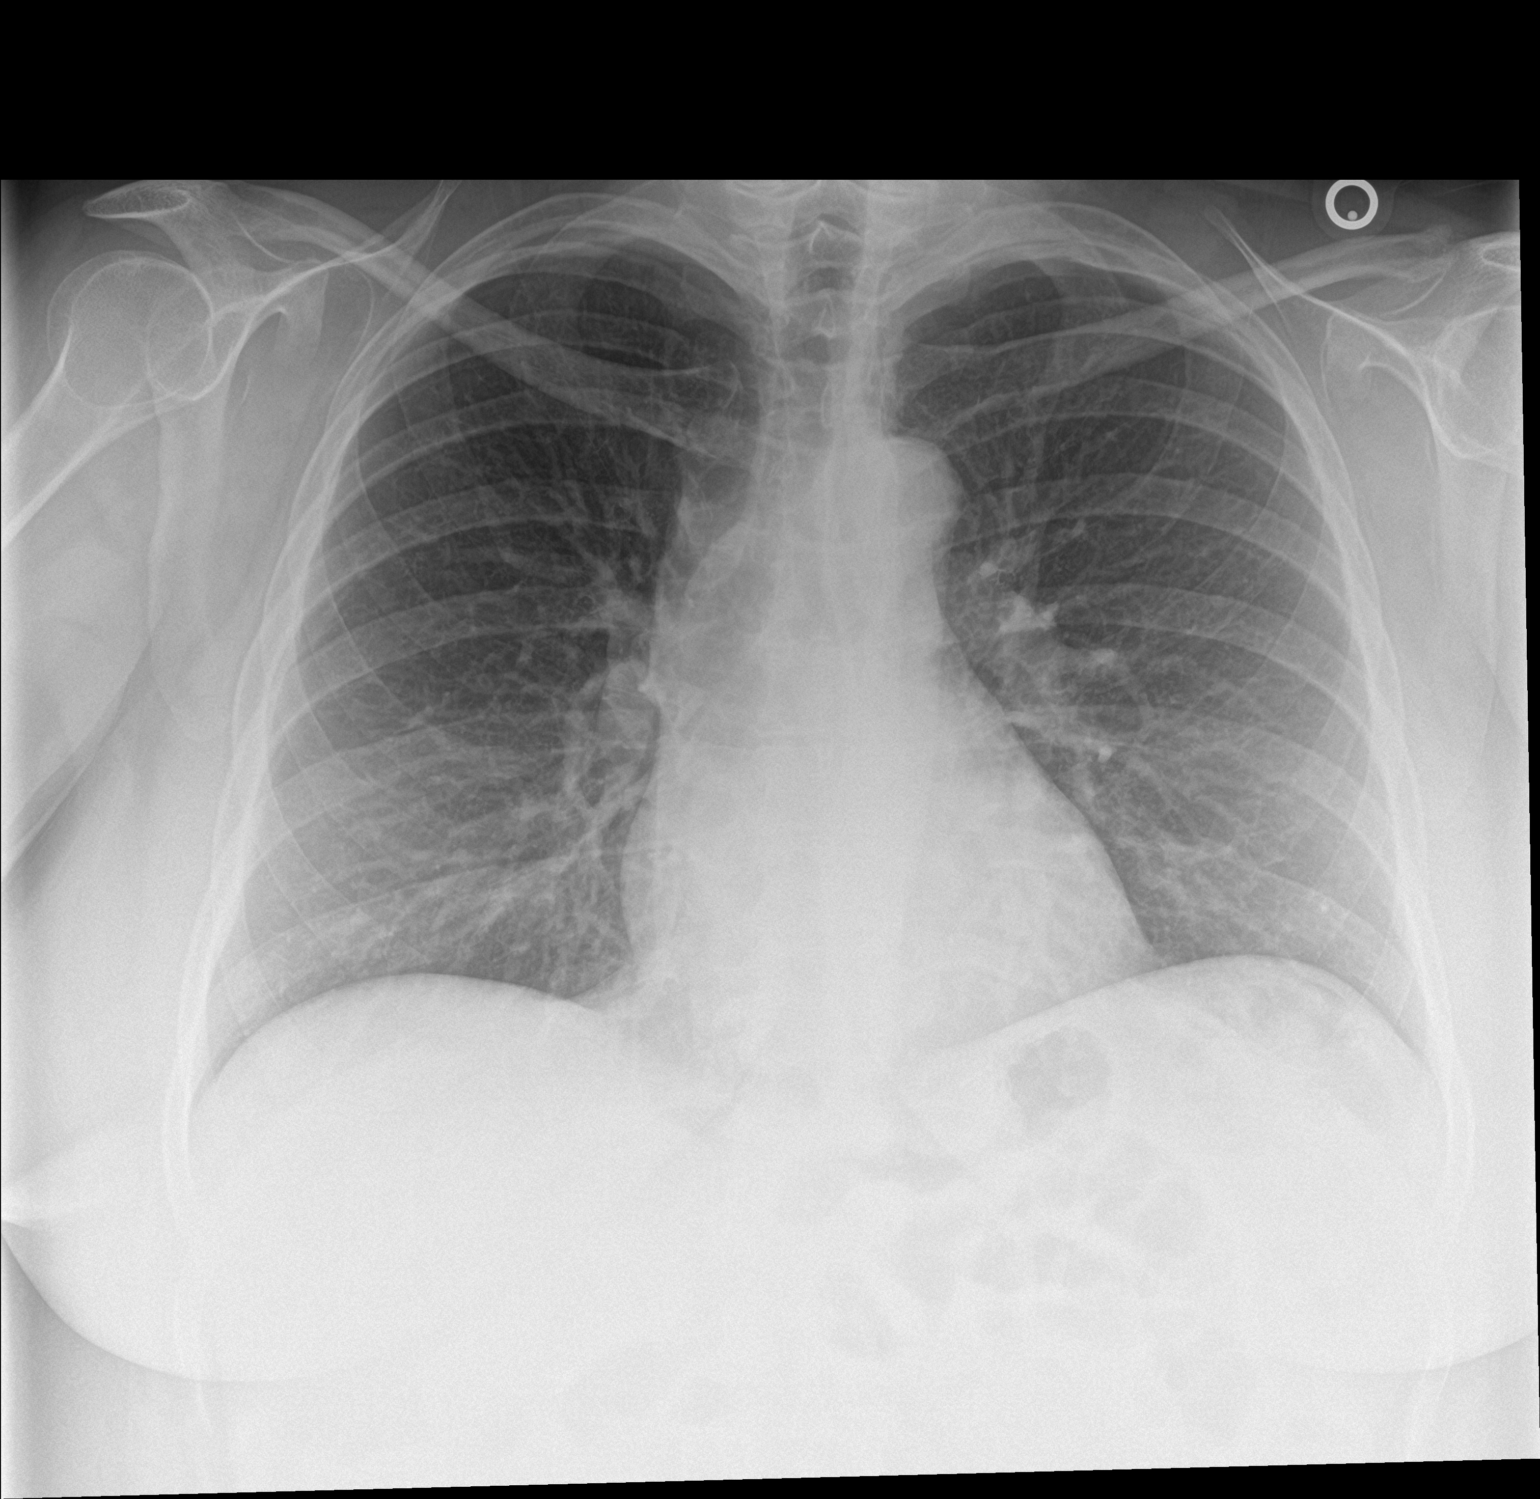

[chest lat]
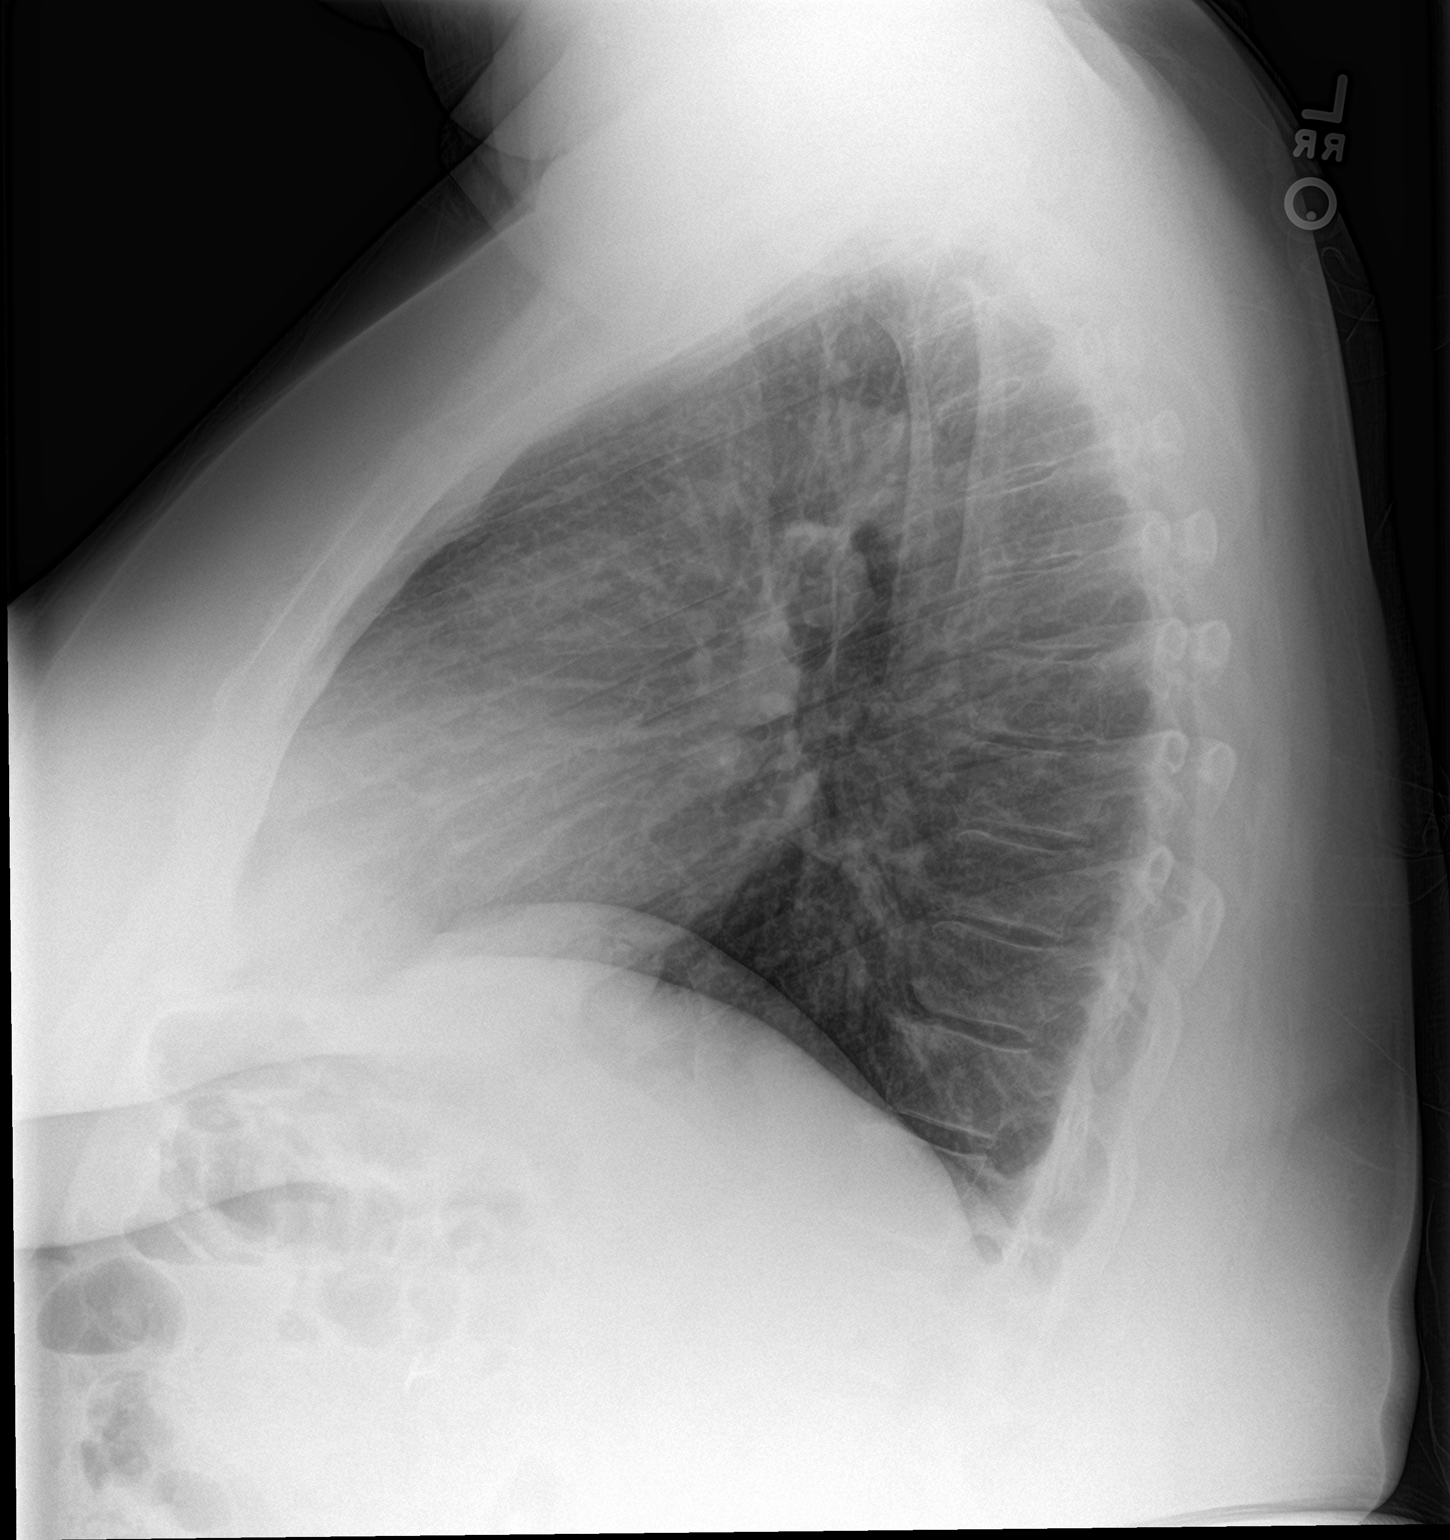

[2 of 2 positions shown; findings below may reference images not displayed]

FINDINGS: The heart size and mediastinal contours are within normal limits.
Both lungs are clear. The visualized skeletal structures are
unremarkable.
IMPRESSION: No acute abnormality of the lungs. No radiographic findings to
explain right-sided rib pain.

## 2021-01-16 ENCOUNTER — Ambulatory Visit: Payer: PRIVATE HEALTH INSURANCE | Admitting: Allergy

## 2021-03-26 NOTE — Progress Notes (Signed)
Follow Up Note  RE: Amber Steele MRN: [id number] DOB: 12-Sep-1965 Date of Office Visit: 03/27/2021  Referring provider: Lelon Huh [name]* Primary care provider: Dellinger, [name]., MD  Chief Complaint: Asthma  History of Present Illness: I had the pleasure of seeing Amber Steele for a follow up visit at the [facility] on 03/27/2021. She is a 56 y.o. female, who is being followed for asthma, allergic rhinitis and GERD. Her previous allergy office visit was on 10/12/2020 with Dr. Selena Batten. Today is a regular follow up visit.  Moderate persistent asthma Still has some episodes of shortness of breath, coughing and wheezing. This usually happens with the weather changes about a few times per month. She has not used her albuterol for this and she is not sure why not. She needs a refill on albuterol.  Today when she woke up she felt some chest tightness. No recent URIs.  Currently taking Alvesco 1 puff BID and Singulair daily.  She noticed some worsening symptoms if she forgot to take her second dose of Alvesco.  Denies any ER/urgent care visits or prednisone use since the last visit.  Patient is getting a CT chest for her nodules later this month.   Allergic rhinitis Still taking zyrtec 10mg  daily and Flonase 2 sprays per nostril daily. One small nosebleed.  Only uses azelastine nasal spray as needed.    Gastroesophageal reflux disease Currently not taking any medications.   Follows with ENT for tonsil stones.   Assessment and Plan: Amber Steele is a 56 y.o. female with: Moderate persistent asthma without complication Some symptoms but not using albuterol. Woke up today feeling that her chest was tight. Today's spirometry was normal - slightly worse than previous. Increase Alvesco 57 to 2 puffs BID for the next few days.  Daily controller medication(s): Alvesco 1 puff twice a day with spacer and rinse mouth afterwards. Continue montelukast  (Singulair) 10mg  daily at night.  During upper respiratory infections/asthma flares:  Increase Alvesco to 2 puffs twice a day with spacer and rinse mouth afterwards for 1-2 weeks until your breathing symptoms return to baseline.  Pretreat with albuterol 2 puffs or albuterol nebulizer.  If you need to use your albuterol nebulizer machine back to back within 15-30 minutes with no relief then please go to the ER/urgent care for further evaluation.  May use albuterol rescue inhaler 2 puffs or nebulizer every 4 to 6 hours as needed for shortness of breath, chest tightness, coughing, and wheezing. May use albuterol rescue inhaler 2 puffs 5 to 15 minutes prior to strenuous physical activities. Monitor frequency of use.  Get spirometry at next visit.  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  Interim history - controlled. Continue with Zyrtec 10mg  at night. Nasal saline spray (i.e., Simply Saline) or nasal saline lavage (i.e., NeilMed) is recommended as needed and prior to medicated nasal sprays. Use Flonase (fluticasone) nasal spray 2 sprays per nostril once a day as needed for nasal congestion.  Use azelastine nasal spray 1-2 sprays per nostril twice a day as needed for runny nose/drainage. Consider repeat testing in future - hold zyrtec and azelastine nasal spray for 3 days before.  Gastroesophageal reflux disease Denies symptoms currently. May use Nexium and famotidine as needed.  Continue dietary and lifestyle modifications.   Return in about 4 months (around 07/25/2021).  Meds ordered this encounter  Medications   albuterol (VENTOLIN HFA)  108 (90 Base) MCG/ACT inhaler    Sig: Inhale 2 puffs into the lungs every 4 (four) hours as needed for wheezing or shortness of breath (coughing fits).    Dispense:  18 g    Refill:  1   montelukast (SINGULAIR) 10 MG tablet    Sig: Take 1 tablet (10 mg total) by mouth at bedtime.     Dispense:  90 tablet    Refill:  3   Lab Orders  No laboratory test(s) ordered today    Diagnostics: Spirometry:  Tracings reviewed. Her effort: Good reproducible efforts. FVC: 3.05L FEV1: 2.55L, 90% predicted FEV1/FVC ratio: 84% Interpretation: Spirometry consistent with normal pattern.  Please see scanned spirometry results for details.  Medication List:  Current Outpatient Medications  Medication Sig Dispense Refill   acetaminophen (TYLENOL) 325 MG tablet Take 650 mg by mouth every 6 (six) hours as needed.     albuterol (PROVENTIL) (2.5 MG/3ML) 0.083% nebulizer solution Take 3 mLs (2.5 mg total) by nebulization every 4 (four) hours as needed for wheezing or shortness of breath. 75 mL 1   albuterol (VENTOLIN HFA) 108 (90 Base) MCG/ACT inhaler Inhale 2 puffs into the lungs every 4 (four) hours as needed for wheezing or shortness of breath (coughing fits). 18 g 1   azelastine (ASTELIN) 0.1 % nasal spray USE 1 TO 2 SPRAYS IN EACH NOSTRIL AS DIRECTED TWICE DAILY AS NEEDED FOR RHINITIS 30 mL 2   cetirizine (ZYRTEC) 10 MG tablet Take 10 mg by mouth daily.     ciclesonide (ALVESCO) 160 MCG/ACT inhaler Inhale 2 puffs into the lungs 2 (two) times daily. with spacer and rinse mouth afterwards. 1 each 5   fluticasone (FLONASE) 50 MCG/ACT nasal spray TWO SPRAYS EACH NOSTRIL ONCE A DAY FOR NASAL CONGESTION OR DRAINAGE. 16 g 5   lisinopril (ZESTRIL) 10 MG tablet Take 10 mg by mouth daily.      metoprolol succinate (TOPROL-XL) 100 MG 24 hr tablet TAKE 1 TABLET BY MOUTH EVERY DAY     montelukast (SINGULAIR) 10 MG tablet Take 1 tablet (10 mg total) by mouth at bedtime. 90 tablet 3   Multiple Vitamin (MULTIVITAMIN) tablet Take 1 tablet by mouth daily.     RA SALINE NASAL SPRAY 0.65 % nasal spray USE 2 SPRAYS BY NASAL ROUTE AS NEEDED FOR CONGESTION  0   vitamin B-12 (CYANOCOBALAMIN) 1000 MCG tablet Take 2,000 mcg by mouth daily.     zolpidem (AMBIEN) 5 MG tablet Take 5 mg by mouth at bedtime as needed  for sleep.     No current facility-administered medications for this visit.   Allergies: Allergies  Allergen Reactions   Sulfa Antibiotics Itching, Nausea Only and Rash   Codeine Other (See Comments)    Arm pain    Fluoxetine Hcl Other (See Comments)    suicidal   Oxycodone-Acetaminophen Other (See Comments)    Arm pain    I reviewed her past medical history, social history, family history, and environmental history and no significant changes have been reported from her previous visit.  Review of Systems  Constitutional:  Negative for appetite change, chills, fever and unexpected weight change.  HENT:  Positive for congestion.   Eyes:  Negative for itching.  Respiratory:  Positive for chest tightness. Negative for cough, shortness of breath and wheezing.   Gastrointestinal:  Negative for abdominal pain.  Skin:  Negative for rash.  Neurological:  Negative for headaches.   Objective: BP 114/70    Pulse Marland Kitchen)  58    Temp 97.9 F (36.6 C) (Temporal)    Resp 16    SpO2 97%  There is no height or weight on file to calculate BMI. Physical Exam Vitals and nursing note reviewed.  Constitutional:      Appearance: Normal appearance. She is well-developed. She is obese.  HENT:     Head: Normocephalic and atraumatic.     Right Ear: Tympanic membrane and external ear normal.     Left Ear: Tympanic membrane and external ear normal.     Nose: Nose normal.  Eyes:     Conjunctiva/sclera: Conjunctivae normal.  Cardiovascular:     Rate and Rhythm: Normal rate and regular rhythm.     Heart sounds: Normal heart sounds. No murmur heard.   No friction rub. No gallop.  Pulmonary:     Effort: Pulmonary effort is normal.     Breath sounds: No wheezing or rales.  Musculoskeletal:     Cervical back: Neck supple.  Skin:    General: Skin is warm.     Findings: No rash.  Neurological:     Mental Status: She is alert and oriented to person, place, and time.  Psychiatric:        Behavior:  Behavior normal.  Previous notes and tests were reviewed. The plan was reviewed with the patient/family, and all questions/concerned were addressed.  It was my pleasure to see Tonjua today and participate in her care. Please feel free to contact me with any questions or concerns.  Sincerely,  Wyline MoodYoon Adama Ferber, DO Allergy & Immunology  [facility] [place] office: [phone] [facility] office: [phone]

## 2021-03-27 ENCOUNTER — Other Ambulatory Visit: Payer: Self-pay

## 2021-03-27 ENCOUNTER — Encounter: Payer: Self-pay | Admitting: Allergy

## 2021-03-27 ENCOUNTER — Ambulatory Visit (INDEPENDENT_AMBULATORY_CARE_PROVIDER_SITE_OTHER): Payer: PRIVATE HEALTH INSURANCE | Admitting: Allergy

## 2021-03-27 VITALS — BP 114/70 | HR 58 | Temp 97.9°F | Resp 16

## 2021-03-27 DIAGNOSIS — J4540 Moderate persistent asthma, uncomplicated: Secondary | ICD-10-CM

## 2021-03-27 DIAGNOSIS — J3089 Other allergic rhinitis: Secondary | ICD-10-CM

## 2021-03-27 DIAGNOSIS — K219 Gastro-esophageal reflux disease without esophagitis: Secondary | ICD-10-CM | POA: Diagnosis not present

## 2021-03-27 MED ORDER — ALBUTEROL SULFATE HFA 108 (90 BASE) MCG/ACT IN AERS: 2.0000 | INHALATION_SPRAY | RESPIRATORY_TRACT | 1 refills | Status: DC | PRN

## 2021-03-27 MED ORDER — MONTELUKAST SODIUM 10 MG PO TABS: 10.0000 mg | ORAL_TABLET | Freq: Every day | ORAL | 3 refills | Status: DC

## 2021-03-27 NOTE — Assessment & Plan Note (Signed)
Some symptoms but not using albuterol. Woke up today feeling that her chest was tight.  Today's spirometry was normal - slightly worse than previous.  Increase Alvesco to 2 puffs BID for the next few days.   Daily controller medication(s): Alvesco 1 puff twice a day with spacer and rinse mouth afterwards. ? Continue montelukast (Singulair) 10mg  daily at night.   During upper respiratory infections/asthma flares:  o Increase Alvesco to 2 puffs twice a day with spacer and rinse mouth afterwards for 1-2 weeks until your breathing symptoms return to baseline.  o Pretreat with albuterol 2 puffs or albuterol nebulizer.  o If you need to use your albuterol nebulizer machine back to back within 15-30 minutes with no relief then please go to the ER/urgent care for further evaluation.   May use albuterol rescue inhaler 2 puffs or nebulizer every 4 to 6 hours as needed for shortness of breath, chest tightness, coughing, and wheezing. May use albuterol rescue inhaler 2 puffs 5 to 15 minutes prior to strenuous physical activities. Monitor frequency of use.   Get spirometry at next visit.

## 2021-03-27 NOTE — Patient Instructions (Addendum)
Moderate persistent asthma Daily controller medication(s): Alvesco 189mcg 1 puff twice a day with spacer and rinse mouth afterwards. Continue montelukast (singulair) 10mg  daily at night.  During upper respiratory infections/asthma flares:  Increase Alvesco 148mcg to 2 puffs twice a day with spacer and rinse mouth afterwards for 1-2 weeks until your breathing symptoms return to baseline.  Pretreat with albuterol 2 puffs or albuterol nebulizer.  If you need to use your albuterol nebulizer machine back to back within 15-30 minutes with no relief then please go to the ER/urgent care for further evaluation.  May use albuterol rescue inhaler 2 puffs or nebulizer every 4 to 6 hours as needed for shortness of breath, chest tightness, coughing, and wheezing. May use albuterol rescue inhaler 2 puffs 5 to 15 minutes prior to strenuous physical activities. Monitor frequency of use.  Asthma control goals:  Full participation in all desired activities (may need albuterol before activity) Albuterol use two times or less a week on average (not counting use with activity) Cough interfering with sleep two times or less a month Oral steroids no more than once a year No hospitalizations   Allergic rhinitis Continue with Zyrtec 10mg  at night. Nasal saline spray (i.e., Simply Saline) or nasal saline lavage (i.e., NeilMed) is recommended as needed and prior to medicated nasal sprays. Use Flonase (fluticasone) nasal spray 2 sprays per nostril once a day as needed for nasal congestion.  Use azelastine nasal spray 1-2 sprays per nostril twice a day as needed for runny nose/drainage. Consider repeat testing in future - hold zyrtec and azelastine nasal spray for 3 days before   Gastroesophageal reflux disease May use Nexium and famotidine as needed.  Continue dietary and lifestyle modifications.   Follow up in 4 months or sooner if needed.

## 2021-03-27 NOTE — Assessment & Plan Note (Signed)
Denies symptoms currently.  May use Nexium and famotidine as needed.   Continue dietary and lifestyle modifications.

## 2021-03-27 NOTE — Assessment & Plan Note (Signed)
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  Interim history - controlled.  Continue with Zyrtec 10mg  at night.  Nasal saline spray (i.e., Simply Saline) or nasal saline lavage (i.e., NeilMed) is recommended as needed and prior to medicated nasal sprays.  Use Flonase (fluticasone) nasal spray 2 sprays per nostril once a day as needed for nasal congestion.   Use azelastine nasal spray 1-2 sprays per nostril twice a day as needed for runny nose/drainage.  Consider repeat testing in future - hold zyrtec and azelastine nasal spray for 3 days before.

## 2021-04-10 ENCOUNTER — Ambulatory Visit: Payer: PRIVATE HEALTH INSURANCE | Admitting: Allergy

## 2021-04-12 ENCOUNTER — Other Ambulatory Visit: Payer: Self-pay | Admitting: Allergy

## 2021-07-29 ENCOUNTER — Ambulatory Visit: Payer: PRIVATE HEALTH INSURANCE | Admitting: Internal Medicine

## 2021-08-27 NOTE — Progress Notes (Deleted)
Follow Up Note  RE: Amber Steele MRN: [id number] DOB: 08/15/1965 Date of Office Visit: 08/28/2021  Referring provider: Lelon Huh Romelle Steele* Primary care provider: Dellinger, Romelle Steele., MD  Chief Complaint: No chief complaint on file.  History of Present Illness: I had the pleasure of seeing Amber Steele for a follow up visit at the [facility] on 08/27/2021. She is a 56 y.o. female, who is being followed for asthma, allergic rhinitis and GERD. Her previous allergy office visit was on 03/27/2021 with Amber Steele. Today is a regular follow up visit.  Moderate persistent asthma without complication Some symptoms but not using albuterol. Woke up today feeling that her chest was tight. Today's spirometry was normal - slightly worse than previous. Increase Alvesco to 2 puffs BID for the next few days.  Daily controller medication(s): Alvesco 1 puff twice a day with spacer and rinse mouth afterwards. Continue montelukast (Singulair) 10mg  daily at night.  During upper respiratory infections/asthma flares:  Increase Alvesco to 2 puffs twice a day with spacer and rinse mouth afterwards for 1-2 weeks until your breathing symptoms return to baseline.  Pretreat with albuterol 2 puffs or albuterol nebulizer.  If you need to use your albuterol nebulizer machine back to back within 15-30 minutes with no relief then please go to the ER/urgent care for further evaluation.  May use albuterol rescue inhaler 2 puffs or nebulizer every 4 to 6 hours as needed for shortness of breath, chest tightness, coughing, and wheezing. May use albuterol rescue inhaler 2 puffs 5 to 15 minutes prior to strenuous physical activities. Monitor frequency of use.  Get spirometry at next visit.   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  Interim history - controlled. Continue with Zyrtec 10mg  at night. Nasal saline spray (i.e.,  Simply Saline) or nasal saline lavage (i.e., NeilMed) is recommended as needed and prior to medicated nasal sprays. Use Flonase (fluticasone) nasal spray 2 sprays per nostril once a day as needed for nasal congestion.  Use azelastine nasal spray 1-2 sprays per nostril twice a day as needed for runny nose/drainage. Consider repeat testing in future - hold zyrtec and azelastine nasal spray for 3 days before.   Gastroesophageal reflux disease Denies symptoms currently. May use Nexium and famotidine as needed.  Continue dietary and lifestyle modifications.    Return in about 4 months (around 07/25/2021).  Assessment and Plan: Doneen is a 56 y.o. female with: No problem-specific Assessment & Plan notes found for this encounter.  No follow-ups on file.  No orders of the defined types were placed in this encounter.  Lab Orders  No laboratory test(s) ordered today    Diagnostics: Spirometry:  Tracings reviewed. Her effort: {Blank single:19197::"Good reproducible efforts.","It was hard to get consistent efforts and there is a question as to whether this reflects a maximal maneuver.","Poor effort, data can not be interpreted."} FVC: ***L FEV1: ***L, ***% predicted FEV1/FVC ratio: ***% Interpretation: {Blank single:19197::"Spirometry consistent with mild obstructive disease","Spirometry consistent with moderate obstructive disease","Spirometry consistent with severe obstructive disease","Spirometry consistent with possible restrictive disease","Spirometry consistent with mixed obstructive and restrictive disease","Spirometry uninterpretable due to technique","Spirometry consistent with normal pattern","No overt abnormalities noted given today's efforts"}.  Please see scanned spirometry results for details.  Skin Testing: {Blank single:19197::"Select foods","Environmental allergy panel","Environmental allergy panel and select foods","Food allergy panel","None","Deferred due to recent antihistamines  use"}. *** Results discussed with patient/family.   Medication List:  Current Outpatient Medications  Medication Sig Dispense Refill   acetaminophen (TYLENOL) 325 MG tablet Take 650 mg by mouth every 6 (six) hours as needed.     albuterol (PROVENTIL) (2.5 MG/3ML) 0.083% nebulizer solution Take 3 mLs (2.5 mg total) by nebulization every 4 (four) hours as needed for wheezing or shortness of breath. 75 mL 1   albuterol (VENTOLIN HFA) 108 (90 Base) MCG/ACT inhaler Inhale 2 puffs into the lungs every 4 (four) hours as needed for wheezing or shortness of breath (coughing fits). 18 g 1   ALVESCO 160 MCG/ACT inhaler INHALE 2 PUFFS INTO LUNGS BY MOUTH TWICE DAILY. USE WITH SPACER AND RINSE MOUTH AFTERWARDS. 6.1 g 5   azelastine (ASTELIN) 0.1 % nasal spray USE 1 TO 2 SPRAYS IN EACH NOSTRIL AS DIRECTED TWICE DAILY AS NEEDED FOR RHINITIS 30 mL 2   cetirizine (ZYRTEC) 10 MG tablet Take 10 mg by mouth daily.     fluticasone (FLONASE) 50 MCG/ACT nasal spray TWO SPRAYS EACH NOSTRIL ONCE A DAY FOR NASAL CONGESTION OR DRAINAGE. 16 g 5   lisinopril (ZESTRIL) 10 MG tablet Take 10 mg by mouth daily.      metoprolol succinate (TOPROL-XL) 100 MG 24 hr tablet TAKE 1 TABLET BY MOUTH EVERY DAY     montelukast (SINGULAIR) 10 MG tablet Take 1 tablet (10 mg total) by mouth at bedtime. 90 tablet 3   Multiple Vitamin (MULTIVITAMIN) tablet Take 1 tablet by mouth daily.     RA SALINE NASAL SPRAY 0.65 % nasal spray USE 2 SPRAYS BY NASAL ROUTE AS NEEDED FOR CONGESTION  0   vitamin B-12 (CYANOCOBALAMIN) 1000 MCG tablet Take 2,000 mcg by mouth daily.     zolpidem (AMBIEN) 5 MG tablet Take 5 mg by mouth at bedtime as needed for sleep.     No current facility-administered medications for this visit.   Allergies: Allergies  Allergen Reactions   Sulfa Antibiotics Itching, Nausea Only and Rash   Codeine Other (See Comments)    Arm pain    Fluoxetine Hcl Other (See Comments)    suicidal   Oxycodone-Acetaminophen Other (See  Comments)    Arm pain    I reviewed her past medical history, social history, family history, and environmental history and no significant changes have been reported from her previous visit.  Review of Systems  Constitutional:  Negative for appetite change, chills, fever and unexpected weight change.  HENT:  Positive for congestion.   Eyes:  Negative for itching.  Respiratory:  Positive for chest tightness. Negative for cough, shortness of breath and wheezing.   Gastrointestinal:  Negative for abdominal pain.  Skin:  Negative for rash.  Neurological:  Negative for headaches.    Objective: There were no vitals taken for this visit. There is no height or weight on file to calculate BMI. Physical Exam Vitals and nursing note reviewed.  Constitutional:      Appearance: Normal appearance. She is well-developed. She is obese.  HENT:     Head: Normocephalic and atraumatic.     Right Ear: Tympanic membrane and external ear normal.     Left Ear: Tympanic membrane and external ear normal.     Nose: Nose normal.  Eyes:     Conjunctiva/sclera: Conjunctivae normal.  Cardiovascular:     Rate and Rhythm: Normal rate and regular rhythm.     Heart sounds: Normal heart sounds. No murmur heard.    No friction rub. No gallop.  Pulmonary:     Effort: Pulmonary  effort is normal.     Breath sounds: No wheezing or rales.  Musculoskeletal:     Cervical back: Neck supple.  Skin:    General: Skin is warm.     Findings: No rash.  Neurological:     Mental Status: She is alert and oriented to person, place, and time.  Psychiatric:        Behavior: Behavior normal.    Previous notes and tests were reviewed. The plan was reviewed with the patient/family, and all questions/concerned were addressed.  It was my pleasure to see Onesti today and participate in her care. Please feel free to contact me with any questions or concerns.  Sincerely,  Wyline MoodYoon Enrrique Mierzwa, DO Allergy & Immunology  [facility] [place] office: [phone] [facility] office: [phone]

## 2021-08-28 ENCOUNTER — Ambulatory Visit: Payer: PRIVATE HEALTH INSURANCE | Admitting: Allergy

## 2021-09-10 NOTE — Progress Notes (Signed)
Follow Up Note  RE: Amber Steele MRN: [id number] DOB: 1965-11-11 Date of Office Visit: 09/11/2021  Referring provider: Frederic Jericho Estevan Ryder* Primary care provider: Dellinger, Estevan Ryder., MD  Chief Complaint: Asthma and Follow-up (Pt reports some improvements in her symptoms. Much better than normal. Occasional flare up, but not major.)  History of Present Illness: I had the pleasure of seeing Amber Steele for a follow up visit at the [facility] [place] [place] on 09/11/2021. She is a 56 y.o. female, who is being followed for asthma, allergic rhinitis and GERD. Her previous allergy office visit was on 03/27/2021 with Dr. Maudie Mercury. Today is a regular follow up visit.  Asthma  Currently on Alvesco 115mcg 2 puffs per day but no longer under the coupon program and would like a different inhaler to be sent in. She was on Flovent previously.   Usually has some symptoms every few weeks with some shortness of breath. The last time her asthma flared was when she was going through her aunt's old boxes who passed away. She had to use albuterol for a few a days with good benefit.   Going to [place] next week with other family members and wants to make sure she has all her medications up to date wit her.    Allergic rhinitis Taking zyrtec 10mg  daily, Flonase 2 sprays per nostril once a day. No nosebleeds. Only using azelastine as needed.   Gastroesophageal reflux disease Denies symptoms currently.  Other She noted some sore throat, voice change x 2 weeks. She looked in her throat and noted white streaks/spots with erythema. It's better now. She does have history of thrush and last episode was about 1 year ago.   She also has ENT visit coming up in July.   Assessment and Plan: Amber Steele is a 56 y.o. female with: Moderate persistent asthma without complication Alvesco no longer covered. Rare use of albuterol. Today's spirometry was normal. Daily controller medication(s): START Flovent 160mcg 1  puff twice a day with spacer and rinse mouth afterwards. Let me know if Flovent is not covered. This replaces Alvesco. Continue montelukast (Singulair) 10mg  daily at night.  During upper respiratory infections/asthma flares:  Increase Flovent 175mcg to 2 puffs twice a day with spacer and rinse mouth afterwards for 1-2 weeks until your breathing symptoms return to baseline.  Pretreat with albuterol 2 puffs or albuterol nebulizer.  If you need to use your albuterol nebulizer machine back to back within 15-30 minutes with no relief then please go to the ER/urgent care for further evaluation.  May use albuterol rescue inhaler 2 puffs or nebulizer every 4 to 6 hours as needed for shortness of breath, chest tightness, coughing, and wheezing. May use albuterol rescue inhaler 2 puffs 5 to 15 minutes prior to strenuous physical activities. Monitor frequency of use.  Get spirometry at next visit.  Oral thrush Take nystatin 31mL swish and swallow 4 times a day. If you notice no improvement then start taking diflucan 150mg  1 tablet daily for 7 days. Patient going out of state next week.  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  Interim history - stable. Continue with Zyrtec 10mg  at night. Nasal saline spray (i.e., Simply Saline) or nasal saline lavage (i.e., NeilMed) is recommended as needed and prior to medicated nasal sprays. Use Flonase (fluticasone) nasal spray 2 sprays per nostril once a day as needed for nasal congestion.  Use azelastine nasal spray 1-2  sprays per nostril twice a day as needed for runny nose/drainage. Consider repeat testing in future - hold zyrtec and azelastine nasal spray for 3 days before.  Gastroesophageal reflux disease Asymptomatic.  May use Nexium and famotidine as needed.  Continue dietary and lifestyle modifications.   Return in about 4 months (around 01/11/2022). Follow up with ENT as scheduled.  Meds ordered  this encounter  Medications   albuterol (PROVENTIL) (2.5 MG/3ML) 0.083% nebulizer solution    Sig: Take 3 mLs (2.5 mg total) by nebulization every 4 (four) hours as needed for wheezing or shortness of breath.    Dispense:  75 mL    Refill:  1   montelukast (SINGULAIR) 10 MG tablet    Sig: Take 1 tablet (10 mg total) by mouth at bedtime.    Dispense:  90 tablet    Refill:  3   cetirizine (ZYRTEC) 10 MG tablet    Sig: Take 1 tablet (10 mg total) by mouth daily.    Dispense:  90 tablet    Refill:  3   fluticasone (FLOVENT HFA) 110 MCG/ACT inhaler    Sig: Inhale 2 puffs into the lungs in the morning and at bedtime. with spacer and rinse mouth afterwards.    Dispense:  1 each    Refill:  5   albuterol (VENTOLIN HFA) 108 (90 Base) MCG/ACT inhaler    Sig: Inhale 2 puffs into the lungs every 4 (four) hours as needed for wheezing or shortness of breath (coughing fits).    Dispense:  18 g    Refill:  1   azelastine (ASTELIN) 0.1 % nasal spray    Sig: Place 1-2 sprays into both nostrils 2 (two) times daily as needed (nasal drainage). Use in each nostril as directed    Dispense:  30 mL    Refill:  5   fluticasone (FLONASE) 50 MCG/ACT nasal spray    Sig: Place 2 sprays into both nostrils daily. For nasal congestion.    Dispense:  48 g    Refill:  2   nystatin (MYCOSTATIN) 100000 UNIT/ML suspension    Sig: Take 5 mLs (500,000 Units total) by mouth 4 (four) times daily. Swish and swallow.    Dispense:  60 mL    Refill:  0   fluconazole (DIFLUCAN) 150 MG tablet    Sig: Take 1 tablet (150 mg total) by mouth daily.    Dispense:  7 tablet    Refill:  0   Lab Orders  No laboratory test(s) ordered today    Diagnostics: Spirometry:  Tracings reviewed. Her effort: Good reproducible efforts. FVC: 3.43L FEV1: 2.67L, 95% predicted FEV1/FVC ratio: 78% Interpretation: Spirometry consistent with normal pattern.  Please see scanned spirometry results for details.  Medication List:  Current  Outpatient Medications  Medication Sig Dispense Refill   acetaminophen (TYLENOL) 325 MG tablet Take 650 mg by mouth every 6 (six) hours as needed.     albuterol (PROVENTIL) (2.5 MG/3ML) 0.083% nebulizer solution Take 3 mLs (2.5 mg total) by nebulization every 4 (four) hours as needed for wheezing or shortness of breath. 75 mL 1   albuterol (VENTOLIN HFA) 108 (90 Base) MCG/ACT inhaler Inhale 2 puffs into the lungs every 4 (four) hours as needed for wheezing or shortness of breath (coughing fits). 18 g 1   azelastine (ASTELIN) 0.1 % nasal spray Place 1-2 sprays into both nostrils 2 (two) times daily as needed (nasal drainage). Use in each nostril as directed 30 mL 5  cetirizine (ZYRTEC) 10 MG tablet Take 1 tablet (10 mg total) by mouth daily. 90 tablet 3   fluconazole (DIFLUCAN) 150 MG tablet Take 1 tablet (150 mg total) by mouth daily. 7 tablet 0   fluticasone (FLONASE) 50 MCG/ACT nasal spray Place 2 sprays into both nostrils daily. For nasal congestion. 48 g 2   fluticasone (FLOVENT HFA) 110 MCG/ACT inhaler Inhale 2 puffs into the lungs in the morning and at bedtime. with spacer and rinse mouth afterwards. 1 each 5   lisinopril (ZESTRIL) 10 MG tablet Take 10 mg by mouth daily.      metoprolol succinate (TOPROL-XL) 100 MG 24 hr tablet TAKE 1 TABLET BY MOUTH EVERY DAY     montelukast (SINGULAIR) 10 MG tablet Take 1 tablet (10 mg total) by mouth at bedtime. 90 tablet 3   Multiple Vitamin (MULTIVITAMIN) tablet Take 1 tablet by mouth daily.     nystatin (MYCOSTATIN) 100000 UNIT/ML suspension Take 5 mLs (500,000 Units total) by mouth 4 (four) times daily. Swish and swallow. 60 mL 0   RA SALINE NASAL SPRAY 0.65 % nasal spray USE 2 SPRAYS BY NASAL ROUTE AS NEEDED FOR CONGESTION  0   vitamin B-12 (CYANOCOBALAMIN) 1000 MCG tablet Take 2,000 mcg by mouth daily.     zolpidem (AMBIEN) 5 MG tablet Take 5 mg by mouth at bedtime as needed for sleep.     No current facility-administered medications for this  visit.   Allergies: Allergies  Allergen Reactions   Sulfa Antibiotics Itching, Nausea Only and Rash   Codeine Other (See Comments)    Arm pain    Fluoxetine Hcl Other (See Comments)    suicidal   Oxycodone-Acetaminophen Other (See Comments)    Arm pain    I reviewed her past medical history, social history, family history, and environmental history and no significant changes have been reported from her previous visit.  Review of Systems  Constitutional:  Negative for appetite change, chills, fever and unexpected weight change.  HENT:  Positive for sore throat and voice change. Negative for congestion and rhinorrhea.   Eyes:  Negative for itching.  Respiratory:  Negative for cough, chest tightness, shortness of breath and wheezing.   Gastrointestinal:  Negative for abdominal pain.  Skin:  Negative for rash.  Neurological:  Negative for headaches.   Objective: BP 124/80   Pulse (!) 59   Temp 97.9 F (36.6 C) (Temporal)   Resp 18   Ht 5\' 6"  (1.676 m)   Wt (!) 311 lb 4.8 oz (141.2 kg)   SpO2 97%   BMI 50.25 kg/m  Body mass index is 50.25 kg/m. Physical Exam Vitals and nursing note reviewed.  Constitutional:      Appearance: Normal appearance. She is well-developed. She is obese.  HENT:     Head: Normocephalic and atraumatic.     Right Ear: Tympanic membrane and external ear normal.     Left Ear: Tympanic membrane and external ear normal.     Nose: Nose normal.     Mouth/Throat:     Comments: Whitish spots on the pharynx Eyes:     Conjunctiva/sclera: Conjunctivae normal.  Cardiovascular:     Rate and Rhythm: Normal rate and regular rhythm.     Heart sounds: Normal heart sounds. No murmur heard.    No friction rub. No gallop.  Pulmonary:     Effort: Pulmonary effort is normal.     Breath sounds: No wheezing or rales.  Musculoskeletal:  Cervical back: Neck supple.  Skin:    General: Skin is warm.     Findings: No rash.  Neurological:     Mental Status: She  is alert and oriented to person, place, and time.  Psychiatric:        Behavior: Behavior normal.    Previous notes and tests were reviewed. The plan was reviewed with the patient/family, and all questions/concerned were addressed.  It was my pleasure to see Amber Steele today and participate in her care. Please feel free to contact me with any questions or concerns.  Sincerely,  Rexene Alberts, DO Allergy & Immunology  [facility] office: [place] office: [phone]

## 2021-09-11 ENCOUNTER — Encounter: Payer: Self-pay | Admitting: Allergy

## 2021-09-11 ENCOUNTER — Ambulatory Visit (INDEPENDENT_AMBULATORY_CARE_PROVIDER_SITE_OTHER): Payer: PRIVATE HEALTH INSURANCE | Admitting: Allergy

## 2021-09-11 VITALS — BP 124/80 | HR 59 | Temp 97.9°F | Resp 18 | Ht 66.0 in | Wt 311.3 lb

## 2021-09-11 DIAGNOSIS — B370 Candidal stomatitis: Secondary | ICD-10-CM | POA: Diagnosis not present

## 2021-09-11 DIAGNOSIS — J4540 Moderate persistent asthma, uncomplicated: Secondary | ICD-10-CM

## 2021-09-11 DIAGNOSIS — J3089 Other allergic rhinitis: Secondary | ICD-10-CM | POA: Diagnosis not present

## 2021-09-11 DIAGNOSIS — K219 Gastro-esophageal reflux disease without esophagitis: Secondary | ICD-10-CM | POA: Diagnosis not present

## 2021-09-11 MED ORDER — CETIRIZINE HCL 10 MG PO TABS: 10.0000 mg | ORAL_TABLET | Freq: Every day | ORAL | 3 refills | Status: AC

## 2021-09-11 MED ORDER — FLUTICASONE PROPIONATE 50 MCG/ACT NA SUSP: 2.0000 | Freq: Every day | NASAL | 2 refills | Status: AC

## 2021-09-11 MED ORDER — AZELASTINE HCL 0.1 % NA SOLN: 1.0000 | Freq: Two times a day (BID) | NASAL | 5 refills | Status: AC | PRN

## 2021-09-11 MED ORDER — NYSTATIN 100000 UNIT/ML MT SUSP: 5.0000 mL | Freq: Four times a day (QID) | OROMUCOSAL | 0 refills | Status: DC

## 2021-09-11 MED ORDER — MONTELUKAST SODIUM 10 MG PO TABS: 10.0000 mg | ORAL_TABLET | Freq: Every day | ORAL | 3 refills | Status: DC

## 2021-09-11 MED ORDER — ALBUTEROL SULFATE (2.5 MG/3ML) 0.083% IN NEBU: 2.5000 mg | INHALATION_SOLUTION | RESPIRATORY_TRACT | 1 refills | Status: DC | PRN

## 2021-09-11 MED ORDER — FLUCONAZOLE 150 MG PO TABS: 150.0000 mg | ORAL_TABLET | Freq: Every day | ORAL | 0 refills | Status: DC

## 2021-09-11 MED ORDER — ALBUTEROL SULFATE HFA 108 (90 BASE) MCG/ACT IN AERS: 2.0000 | INHALATION_SPRAY | RESPIRATORY_TRACT | 1 refills | Status: DC | PRN

## 2021-09-11 MED ORDER — FLUTICASONE PROPIONATE HFA 110 MCG/ACT IN AERO: 2.0000 | INHALATION_SPRAY | Freq: Two times a day (BID) | RESPIRATORY_TRACT | 5 refills | Status: DC

## 2021-09-11 NOTE — Patient Instructions (Addendum)
Moderate persistent asthma Daily controller medication(s): START Flovent 1 puff twice a day with spacer and rinse mouth afterwards. Let me know if Flovent is not covered. This replaces Alvesco. Continue montelukast (singulair) 10mg  daily at night.  During upper respiratory infections/asthma flares:  Increase Flovent to 2 puffs twice a day with spacer and rinse mouth afterwards for 1-2 weeks until your breathing symptoms return to baseline.  Pretreat with albuterol 2 puffs or albuterol nebulizer.  If you need to use your albuterol nebulizer machine back to back within 15-30 minutes with no relief then please go to the ER/urgent care for further evaluation.  May use albuterol rescue inhaler 2 puffs or nebulizer every 4 to 6 hours as needed for shortness of breath, chest tightness, coughing, and wheezing. May use albuterol rescue inhaler 2 puffs 5 to 15 minutes prior to strenuous physical activities. Monitor frequency of use.  Asthma control goals:  Full participation in all desired activities (may need albuterol before activity) Albuterol use two times or less a week on average (not counting use with activity) Cough interfering with sleep two times or less a month Oral steroids no more than once a year No hospitalizations   Allergic rhinitis Continue with Zyrtec 10mg  at night. Nasal saline spray (i.e., Simply Saline) or nasal saline lavage (i.e., NeilMed) is recommended as needed and prior to medicated nasal sprays. Use Flonase (fluticasone) nasal spray 2 sprays per nostril once a day as needed for nasal congestion.  Use azelastine nasal spray 1-2 sprays per nostril twice a day as needed for runny nose/drainage. Consider repeat testing in future - hold zyrtec and azelastine nasal spray for 3 days before   Gastroesophageal reflux disease Continue dietary and lifestyle modifications.  May use Nexium and famotidine as needed.   Thrush  Take nystatin 60mL swish and swallow 4 times  a day. If you notice no improvement then start taking diflucan 150mg  1 tablet daily for 7 days.   Throat issues Follow up with ENT.  Follow up in 4 months or sooner if needed.   After September 2023, I will not be in the [facility] office on a regular basis. You can continue your care and follow up with Dr. , Dr. 08-03-1985 or the nurse practitioners [facility] or Maurine Minister) in [facility]. OR you can follow up with me in our [place] ([address]) or [facility] [phone]) location.   Sincerely,  [place], DO  [facility] office: [phone] [facility] office: [phone] [place] office: [phone]

## 2021-09-11 NOTE — Assessment & Plan Note (Signed)
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  Interim history - stable.  Continue with Zyrtec 10mg  at night.  Nasal saline spray (i.e., Simply Saline) or nasal saline lavage (i.e., NeilMed) is recommended as needed and prior to medicated nasal sprays.  Use Flonase (fluticasone) nasal spray 2 sprays per nostril once a day as needed for nasal congestion.   Use azelastine nasal spray 1-2 sprays per nostril twice a day as needed for runny nose/drainage.  Consider repeat testing in future - hold zyrtec and azelastine nasal spray for 3 days before.

## 2021-09-11 NOTE — Assessment & Plan Note (Signed)
.   Take nystatin 73mL swish and swallow 4 times a day. If you notice no improvement then start taking diflucan 150mg  1 tablet daily for 7 days. Patient going out of state next week.

## 2021-09-11 NOTE — Assessment & Plan Note (Signed)
Alvesco no longer covered. Rare use of albuterol.  Today's spirometry was normal. . Daily controller medication(s): START Flovent 1 puff twice a day with spacer and rinse mouth afterwards. ? Let me know if Flovent is not covered. This replaces Alvesco. ? Continue montelukast (Singulair) 10mg  daily at night.  . During upper respiratory infections/asthma flares:  o Increase Flovent to 2 puffs twice a day with spacer and rinse mouth afterwards for 1-2 weeks until your breathing symptoms return to baseline.  o Pretreat with albuterol 2 puffs or albuterol nebulizer.  o If you need to use your albuterol nebulizer machine back to back within 15-30 minutes with no relief then please go to the ER/urgent care for further evaluation.  . May use albuterol rescue inhaler 2 puffs or nebulizer every 4 to 6 hours as needed for shortness of breath, chest tightness, coughing, and wheezing. May use albuterol rescue inhaler 2 puffs 5 to 15 minutes prior to strenuous physical activities. Monitor frequency of use.  . Get spirometry at next visit.

## 2021-09-11 NOTE — Assessment & Plan Note (Signed)
Asymptomatic.   May use Nexium and famotidine as needed.   Continue dietary and lifestyle modifications.

## 2022-01-13 ENCOUNTER — Ambulatory Visit: Payer: PRIVATE HEALTH INSURANCE | Admitting: Allergy

## 2022-06-24 ENCOUNTER — Other Ambulatory Visit: Payer: Self-pay | Admitting: *Deleted

## 2022-06-24 ENCOUNTER — Telehealth: Payer: Self-pay | Admitting: Allergy

## 2022-06-24 NOTE — Telephone Encounter (Signed)
Had some issues locating the pharmacy in [place]. Called and left a voicemail asking for patient to return call to discuss pharmacy information.

## 2022-06-24 NOTE — Telephone Encounter (Signed)
Patient called stating she needed to resent her Prescription  to [facility] as a 3 month supply fax number [phone] fluticasone (FLOVENT HFA) 110 MCG/ACT inhaler [[id number]]  this is more affordable please advise

## 2022-06-25 ENCOUNTER — Other Ambulatory Visit: Payer: Self-pay | Admitting: *Deleted

## 2022-06-25 MED ORDER — FLUTICASONE PROPIONATE HFA 110 MCG/ACT IN AERO: 2.0000 | INHALATION_SPRAY | Freq: Two times a day (BID) | RESPIRATORY_TRACT | 3 refills | Status: DC

## 2022-06-25 NOTE — Telephone Encounter (Signed)
Patient was returning your call [phone]

## 2022-06-25 NOTE — Telephone Encounter (Signed)
Called and spoke with patient and received information needed to send in prescription. Prescription has been sent in to requested pharmacy. Patient verbalized understanding.

## 2022-09-15 ENCOUNTER — Other Ambulatory Visit: Payer: Self-pay | Admitting: Allergy

## 2022-09-17 ENCOUNTER — Other Ambulatory Visit: Payer: Self-pay | Admitting: Allergy

## 2023-02-10 ENCOUNTER — Encounter: Payer: Self-pay | Admitting: Internal Medicine

## 2023-02-10 ENCOUNTER — Other Ambulatory Visit: Payer: Self-pay

## 2023-02-10 ENCOUNTER — Ambulatory Visit (INDEPENDENT_AMBULATORY_CARE_PROVIDER_SITE_OTHER): Payer: 59 | Admitting: Internal Medicine

## 2023-02-10 VITALS — BP 110/74 | HR 80 | Temp 97.5°F | Resp 16 | Ht 65.0 in | Wt 254.5 lb

## 2023-02-10 DIAGNOSIS — R1110 Vomiting, unspecified: Secondary | ICD-10-CM

## 2023-02-10 DIAGNOSIS — R197 Diarrhea, unspecified: Secondary | ICD-10-CM

## 2023-02-10 DIAGNOSIS — J3089 Other allergic rhinitis: Secondary | ICD-10-CM | POA: Diagnosis not present

## 2023-02-10 DIAGNOSIS — K9049 Malabsorption due to intolerance, not elsewhere classified: Secondary | ICD-10-CM

## 2023-02-10 DIAGNOSIS — R1084 Generalized abdominal pain: Secondary | ICD-10-CM | POA: Diagnosis not present

## 2023-02-10 DIAGNOSIS — K219 Gastro-esophageal reflux disease without esophagitis: Secondary | ICD-10-CM

## 2023-02-10 DIAGNOSIS — J4540 Moderate persistent asthma, uncomplicated: Secondary | ICD-10-CM

## 2023-02-10 NOTE — Patient Instructions (Addendum)
Moderate persistent asthma Daily controller medication(s): Flovent 2 puffs once a day with spacer and rinse mouth afterwards. Discontinue montelukast (singulair); no longer taking and hasn't noticed any difference in symptoms During upper respiratory infections/asthma flares:  Increase Flovent to 2 puffs twice a day with spacer and rinse mouth afterwards for 1-2 weeks until your breathing symptoms return to baseline.  Pretreat with albuterol 2 puffs or albuterol nebulizer.  If you need to use your albuterol nebulizer machine back to back within 15-30 minutes with no relief then please go to the ER/urgent care for further evaluation.  May use albuterol rescue inhaler 2 puffs or nebulizer every 4 to 6 hours as needed for shortness of breath, chest tightness, coughing, and wheezing. May use albuterol rescue inhaler 2 puffs 5 to 15 minutes prior to strenuous physical activities. Monitor frequency of use.  Asthma control goals:  Full participation in all desired activities (may need albuterol before activity) Albuterol use two times or less a week on average (not counting use with activity) Cough interfering with sleep two times or less a month Oral steroids no more than once a year No hospitalizations   Allergic rhinitis Continue with Zyrtec 10mg  at night. Nasal saline spray (i.e., Simply Saline) or nasal saline lavage (i.e., NeilMed) is recommended as needed and prior to medicated nasal sprays. Use Flonase (fluticasone) nasal spray 2 sprays per nostril once a day as needed for nasal congestion.  Use azelastine nasal spray 1-2 sprays per nostril twice a day as needed for runny nose/drainage. Consider repeat testing in future - hold zyrtec and azelastine nasal spray for 3 days before   Gastroesophageal reflux disease Continue dietary and lifestyle modifications.  Acid reflux meds as discussed with ENT  Hoarseness/vocal cord issues:  Continue ENT follow-up for hoarseness/vocal cord  issues  Gluten allergy vs intolerance:  - recommend avoidance. -labs today for wheat, barley, rye - return for skin testing for confirmation   Follow up for skin testing (environmental allergies, wheat, rye, barley) It was a pleasure meeting you in clinic today! Thank you for allowing me to participate in your care.  Tonny Bollman, MD [facility]

## 2023-02-10 NOTE — Progress Notes (Signed)
FOLLOW UP Date of Service/Encounter:  02/10/23  Subjective:  Amber Steele (DOB: 15-Sep-1965) is a 57 y.o. female who [facility] in re-evaluation of the following: Moderate persistent asthma, allergic rhinitis, anosmia History obtained from: chart review and patient.  For Review, LV was on 09/11/21  with Dr. Selena Batten seen for routine follow-up. See below for summary of history and diagnostics.   Therapeutic plans/changes recommended: switched to Flovent 110 mcg 1 puff BID, alvesco not covered. She did have thrust at her visit, and was prescribed nystatin. Spirometry was normal with FEV1 95% ----------------------------------------------------- Pertinent History/Diagnostics:  Moderate persistent asthma without complication  Rare use of albuterol. 2020 CXR normal Other allergic rhinitis Past history - Anosmia since January 2022 - no formal COVID-19 diagnosis.  Did allergy injections over 20 years ago.  --------------------------------------------------- Today presents for follow-up. Discussed the use of AI scribe software for clinical note transcription with the patient, who gave verbal consent to proceed.  History of Present Illness   The patient, with a history of asthma and allergies, has been managing their symptoms with Flovent 110, one puff twice a day, primarily at night. They adjust the dosage based on their symptoms, reducing the morning dose when their asthma is under control. They report occasional issues, particularly during weather changes and temperature swings. Their use of Albuterol, a rescue inhaler, is infrequent, with the last use being several months ago. They have not required any steroids or antibiotics since the last visit.  For their allergies, they take Zyrtec daily and use Flonase nasal spray. They report noticeable symptoms if they miss a dose of Flonase. They also have azelastine nasal spray, which they use only when  experiencing increased congestion or when Flonase is insufficient. They recently discontinued Singulair, noticing no difference in their symptoms.  The patient also reports history of acid reflux, which is currently managed with medication. However, they have reduced their dosage due to stomach discomfort. They also report throat issues, which they initially thought might be related to acid reflux, but now suspect may be due to other factors, including potential mold allergies. They are seeing both an integrative medicine doctor and ENT.  The patient also reports a severe reaction to gluten, resulting in immediate gastrointestinal distress, including stomach rolling, urgent bowel movements, and dry heaving. They have been avoiding gluten due to these symptoms.      Chart Review: 12/31/22: initial visit with Dr. Christell Constant for hoarseness, hx of deviated septum, vocal cord paralysis, oropharygeal dysphagia "Indirect laryngoscopy shows normal vocal cords that move well on phonation with no concerning lesions " Trial of BID PPI.  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 Gave a triptan to use acutely "  All medications reviewed by clinical staff and updated in chart. No new pertinent medical or surgical history except as noted in HPI.  ROS: All others negative except as noted per HPI.   Objective:  BP 110/74 (BP Location: Left Arm, Patient Position: Sitting, Cuff Size: Large)   Pulse 80   Temp (!) 97.5 F (36.4 C) (Temporal)   Resp 16   Ht 5\' 5"  (1.651 m)   Wt 254 lb 8 oz (115.4 kg)   SpO2 99%   BMI 42.35 kg/m  Body mass index is 42.35 kg/m. Physical Exam: General Appearance:  Alert, cooperative, no distress, appears stated age  Head:  Normocephalic, without obvious abnormality, atraumatic  Eyes:  Conjunctiva clear, EOM's intact  Ears EACs normal  bilaterally and normal TMs bilaterally  Nose: Nares normal, hypertrophic turbinates and normal mucosa  Throat:  Lips, tongue normal; teeth and gums normal, normal posterior oropharynx  Neck: Supple, symmetrical  Lungs:   clear to auscultation bilaterally, Respirations unlabored, no coughing  Heart:  regular rate and rhythm and no murmur, Appears well perfused  Extremities: No edema  Skin: Skin color, texture, turgor normal and no rashes or lesions on visualized portions of skin  Neurologic: No gross deficits   Labs:  Lab Orders         Wheat IgE         Allergen Barley f6         Allergen, Rye grass, g5         Allergen, Rye, f5      Spirometry:  Tracings reviewed. Her effort: Good reproducible efforts. FVC: 3.56L FEV1: 3.01L, 111% predicted FEV1/FVC ratio: 0.85 Interpretation: Spirometry consistent with normal pattern.  Please see scanned spirometry results for details.  Assessment/Plan   Moderate persistent asthma-at goal Daily controller medication(s): Flovent 2 puffs once a day with spacer and rinse mouth afterwards. Discontinue montelukast (singulair); no longer taking and hasn't noticed any difference in symptoms During upper respiratory infections/asthma flares:  Increase Flovent to 2 puffs twice a day with spacer and rinse mouth afterwards for 1-2 weeks until your breathing symptoms return to baseline.  Pretreat with albuterol 2 puffs or albuterol nebulizer.  If you need to use your albuterol nebulizer machine back to back within 15-30 minutes with no relief then please go to the ER/urgent care for further evaluation.  May use albuterol rescue inhaler 2 puffs or nebulizer every 4 to 6 hours as needed for shortness of breath, chest tightness, coughing, and wheezing. May use albuterol rescue inhaler 2 puffs 5 to 15 minutes prior to strenuous physical activities. Monitor frequency of use.  Asthma control goals:  Full participation in all desired activities (may need albuterol before activity) Albuterol use two times or less a week on average (not counting use with  activity) Cough interfering with sleep two times or less a month Oral steroids no more than once a year No hospitalizations   Allergic rhinitis-controlled with meds, discussed AIT for prevention Continue with Zyrtec 10mg  at night. Nasal saline spray (i.e., Simply Saline) or nasal saline lavage (i.e., NeilMed) is recommended as needed and prior to medicated nasal sprays. Use Flonase (fluticasone) nasal spray 2 sprays per nostril once a day as needed for nasal congestion.  Use azelastine nasal spray 1-2 sprays per nostril twice a day as needed for runny nose/drainage. Consider repeat testing in future - hold zyrtec and azelastine nasal spray for 3 days before   Gastroesophageal reflux disease-managed by ENT Continue dietary and lifestyle modifications.  Acid reflux meds as discussed with ENT  Hoarseness/vocal cord issues: seeing ENT Continue ENT follow-up for hoarseness/vocal cord issues  Gluten allergy vs intolerance:  - recommend avoidance. -labs today for wheat, barley, rye - return for skin testing for confirmation   Follow up for skin testing (environmental allergies, wheat, rye, barley) It was a pleasure meeting you in clinic today! Thank you for allowing me to participate in your care. Other: none  Tonny Bollman, MD  [facility]

## 2023-02-12 LAB — ALLERGEN BARLEY F6: Allergen Barley IgE: <0.1 kU/L

## 2023-02-12 LAB — ALLERGEN, WHEAT, F4: Wheat IgE: <0.1 kU/L

## 2023-02-14 LAB — ALLERGEN, RYE, F5: Rye IgE: <0.1 kU/L

## 2023-02-16 NOTE — Progress Notes (Signed)
Please let Ms. Mckinlay know that her gluten allergy testing (wheat, barley and rye allergic antibodies) were negative. This does not rule out gluten intolerance.

## 2023-02-27 ENCOUNTER — Ambulatory Visit: Payer: PRIVATE HEALTH INSURANCE | Admitting: Internal Medicine

## 2023-03-06 ENCOUNTER — Ambulatory Visit (INDEPENDENT_AMBULATORY_CARE_PROVIDER_SITE_OTHER): Payer: 59 | Admitting: Internal Medicine

## 2023-03-06 ENCOUNTER — Encounter: Payer: Self-pay | Admitting: Internal Medicine

## 2023-03-06 ENCOUNTER — Telehealth: Payer: Self-pay

## 2023-03-06 VITALS — BP 118/78 | HR 84 | Temp 97.3°F | Resp 17 | Ht 66.0 in | Wt 250.4 lb

## 2023-03-06 DIAGNOSIS — K219 Gastro-esophageal reflux disease without esophagitis: Secondary | ICD-10-CM | POA: Diagnosis not present

## 2023-03-06 DIAGNOSIS — J4541 Moderate persistent asthma with (acute) exacerbation: Secondary | ICD-10-CM

## 2023-03-06 DIAGNOSIS — J3089 Other allergic rhinitis: Secondary | ICD-10-CM | POA: Diagnosis not present

## 2023-03-06 DIAGNOSIS — K9049 Malabsorption due to intolerance, not elsewhere classified: Secondary | ICD-10-CM

## 2023-03-06 MED ORDER — METHYLPREDNISOLONE ACETATE 40 MG/ML IJ SUSP
40.0000 mg | Freq: Once | INTRAMUSCULAR | Status: AC
  Administered 2023-03-06: 40 mg via INTRAMUSCULAR

## 2023-03-06 MED ORDER — PREDNISONE 20 MG PO TABS: 20.0000 mg | ORAL_TABLET | Freq: Every day | ORAL | 0 refills | Status: AC

## 2023-03-06 MED ORDER — FLUTICASONE PROPIONATE HFA 110 MCG/ACT IN AERO: 2.0000 | INHALATION_SPRAY | Freq: Two times a day (BID) | RESPIRATORY_TRACT | 5 refills | Status: DC

## 2023-03-06 NOTE — Progress Notes (Signed)
FOLLOW UP Date of Service/Encounter:  03/06/23  Subjective:  Amber Steele (DOB: January 05, 1966) is a 57 y.o. female who [facility]/2023 in re-evaluation of the following: acute visit for cough/congestion History obtained from: chart review and patient.  For Review, LV was on 02/10/23  with Dr.Niaya Hickok seen for routine follow-up. See below for summary of history and diagnostics.   Therapeutic plans/changes recommended: plan for her to follow-up for skin testing which was deferred to her being sick.  FEV1 111% ----------------------------------------------------- Pertinent History/Diagnostics:  Moderate persistent asthma without complication  Rare use of albuterol. Off Singulair as of 01/2023.  Using Flovent 110 2 puffs daily. 2020 CXR normal Other allergic rhinitis Past history - Anosmia since January 2022 - no formal COVID-19 diagnosis.  Did allergy injections over 20 years ago.  Food intolerance Gluten causes immediate gastrointestinal distress, including stomach rolling, urgent bowel movements, and dry heaving  IgE rye, barley and wheat all negative Reflux:  Prescribed acid reflux medications by ENT --------------------------------------------------- Today presents for follow-up. Discussed the use of AI scribe software for clinical note transcription with the patient, who gave verbal consent to proceed.  History of Present Illness   The patient, with a history of asthma, presented with an acute exacerbation of symptoms that began two weeks ago. She described a sensation of 'pushing' in her chest, followed by a severe cough and difficulty breathing. The patient self-managed initially by increasing her inhaler dosage and using a nebulizer. Despite these measures, her symptoms persisted and were accompanied by a low-grade fever.  The patient sought medical attention from her primary care provider via a virtual visit, during which she was diagnosed  with acute bronchitis. She was prescribed a Z-Pak and a new albuterol inhaler, which she began last Thursday. Despite this treatment, the patient continued to cough up dark, hard phlegm and experienced a drop in oxygen saturation to 94%, which improved with inhaler use.  The patient's condition did not improve significantly over the following days, leading her to seek care at an urgent care center. She was given additional nebulizer tubes and advised to see her asthma specialist. The patient reported some improvement in her symptoms over the past two days but noted that she still tires easily and has difficulty breathing after exertion. She also reported some chills and a possible low-grade fever. The patient has not noticed significant postnasal drainage or changes in her sense of taste or smell. She has, however, experienced some sneezing in the past few days.      Chart Review: She saw primary via telehealth and was treated with z-pack and increased her inhalers.  She went back to PCP and was told to see allergy. She then saw UC who agreed it was her asthma and refilled her albuterol.   02/26/23: PCP virtual visit: diagnosed with acute bronchitis, given z-pack and albuterol refill 03/04/23: UC visit: albuterol nebulizer solution refilled   All medications reviewed by clinical staff and updated in chart. No new pertinent medical or surgical history except as noted in HPI.  ROS: All others negative except as noted per HPI.   Objective:  BP 118/78   Pulse 84   Temp (!) 97.3 F (36.3 C) (Temporal)   Resp 17   Ht 5\' 6"  (1.676 m)   Wt 250 lb 6.4 oz (113.6 kg)   SpO2 98%   BMI 40.42 kg/m  Body mass index is 40.42 kg/m. Physical Exam: General Appearance:  Alert, cooperative, no  distress, appears stated age  Head:  Normocephalic, without obvious abnormality, atraumatic  Eyes:  Conjunctiva clear, EOM's intact  Ears Serous fluid present behind bilateral TMs, no bulging, no erythema and  EACs normal bilaterally  Nose: Nares normal, hypertrophic turbinates, normal mucosa, and no visible anterior polyps  Throat: Lips, tongue normal; teeth and gums normal, normal posterior oropharynx and + cobblestoning  Neck: Supple, symmetrical  Lungs:   clear to auscultation bilaterally, Respirations unlabored, intermittent dry coughing  Heart:  regular rate and rhythm and no murmur, Appears well perfused  Extremities: No edema  Skin: Skin color, texture, turgor normal and no rashes or lesions on visualized portions of skin  Neurologic: No gross deficits   Labs:  Lab Orders  No laboratory test(s) ordered today    Spirometry:  Tracings reviewed. Her effort: Good reproducible efforts. FVC: 3.28L FEV1: 2.79L, 100% predicted FEV1/FVC ratio: 0.85 Interpretation: Spirometry consistent with normal pattern.  Please see scanned spirometry results for details.  Assessment/Plan  Moderate persistent asthma with exacerbation:  -40 mg IM Depo-Medrol in clinic today - Tomorrow start 20 mg prednisone and continue daily for 4 days If not feeling better by Monday, call clinic for prescription for Augmentin 875 twice daily for 7 to 10 days -Continue Flovent 110 2 puffs twice daily for the next 2 weeks or until coughing and symptoms subside - Can use albuterol either via nebulizer or inhaler every 4 hours scheduled while awake for the next few days Daily controller medication(s): Flovent 2 puffs once a day with spacer and rinse mouth afterwards. During upper respiratory infections/asthma flares:  Increase Flovent to 2 puffs twice a day with spacer and rinse mouth afterwards for 1-2 weeks until your breathing symptoms return to baseline.  Pretreat with albuterol 2 puffs or albuterol nebulizer.  If you need to use your albuterol nebulizer machine back to back within 15-30 minutes with no relief then please go to the ER/urgent care for further evaluation.  May use albuterol rescue inhaler 2  puffs or nebulizer every 4 to 6 hours as needed for shortness of breath, chest tightness, coughing, and wheezing. May use albuterol rescue inhaler 2 puffs 5 to 15 minutes prior to strenuous physical activities. Monitor frequency of use.  Asthma control goals:  Full participation in all desired activities (may need albuterol before activity) Albuterol use two times or less a week on average (not counting use with activity) Cough interfering with sleep two times or less a month Oral steroids no more than once a year No hospitalizations   Allergic rhinitis-not at goal Continue with Zyrtec 10mg  at night. Nasal saline spray (i.e., Simply Saline) or nasal saline lavage (i.e., NeilMed) is recommended as needed and prior to medicated nasal sprays. Use Flonase (fluticasone) nasal spray 2 sprays per nostril once a day as needed for nasal congestion.  Use azelastine nasal spray 1-2 sprays per nostril twice a day as needed for runny nose/drainage. Consider repeat testing in future - hold zyrtec and azelastine nasal spray for 3 days before   Gastroesophageal reflux disease stable Continue dietary and lifestyle modifications.  Acid reflux meds as discussed with ENT  Hoarseness/vocal cord issues: Stable Continue ENT follow-up for hoarseness/vocal cord issues  Gluten allergy vs intolerance: Stable - recommend avoidance. -labs today for wheat, barley, rye - return for skin testing for confirmation   Follow up for skin testing (environmental allergies, wheat, rye, barley) once feeling better. It was a pleasure seeing you again in clinic today! Thank you  for allowing me to participate in your care.  Other: none  Tonny Bollman, MD  [facility]

## 2023-03-06 NOTE — Patient Instructions (Addendum)
Moderate persistent asthma with exacerbation:  -40 mg IM Depo-Medrol in clinic today - Tomorrow start 20 mg prednisone and continue daily for 4 days If not feeling better by Monday, call clinic for prescription for Augmentin 875 twice daily for 7 to 10 days -Continue Flovent 110 2 puffs twice daily for the next 2 weeks or until coughing and symptoms subside - Can use albuterol either via nebulizer or inhaler every 4 hours scheduled while awake for the next few days Daily controller medication(s): Flovent 2 puffs once a day with spacer and rinse mouth afterwards. During upper respiratory infections/asthma flares:  Increase Flovent to 2 puffs twice a day with spacer and rinse mouth afterwards for 1-2 weeks until your breathing symptoms return to baseline.  Pretreat with albuterol 2 puffs or albuterol nebulizer.  If you need to use your albuterol nebulizer machine back to back within 15-30 minutes with no relief then please go to the ER/urgent care for further evaluation.  May use albuterol rescue inhaler 2 puffs or nebulizer every 4 to 6 hours as needed for shortness of breath, chest tightness, coughing, and wheezing. May use albuterol rescue inhaler 2 puffs 5 to 15 minutes prior to strenuous physical activities. Monitor frequency of use.  Asthma control goals:  Full participation in all desired activities (may need albuterol before activity) Albuterol use two times or less a week on average (not counting use with activity) Cough interfering with sleep two times or less a month Oral steroids no more than once a year No hospitalizations   Allergic rhinitis Continue with Zyrtec 10mg  at night. Nasal saline spray (i.e., Simply Saline) or nasal saline lavage (i.e., NeilMed) is recommended as needed and prior to medicated nasal sprays. Use Flonase (fluticasone) nasal spray 2 sprays per nostril once a day as needed for nasal congestion.  Use azelastine nasal spray 1-2 sprays per nostril  twice a day as needed for runny nose/drainage. Consider repeat testing in future - hold zyrtec and azelastine nasal spray for 3 days before   Gastroesophageal reflux disease Continue dietary and lifestyle modifications.  Acid reflux meds as discussed with ENT  Hoarseness/vocal cord issues:  Continue ENT follow-up for hoarseness/vocal cord issues  Gluten allergy vs intolerance:  - recommend avoidance. -labs today for wheat, barley, rye - return for skin testing for confirmation   Follow up for skin testing (environmental allergies, wheat, rye, barley) It was a pleasure seeing you again in clinic today! Thank you for allowing me to participate in your care.  Tonny Bollman, MD [facility]

## 2023-03-06 NOTE — Telephone Encounter (Signed)
Patient calling to make sure her prescriptions were sent in. I told her they were sent to the [facility] [address] [place] at 1:24 pm. Told patient the prescriptions should be there just call the pharmacy and if not there call the office.

## 2023-03-09 ENCOUNTER — Other Ambulatory Visit: Payer: Self-pay

## 2023-03-09 ENCOUNTER — Telehealth: Payer: Self-pay | Admitting: Internal Medicine

## 2023-03-09 MED ORDER — FLUTICASONE PROPIONATE HFA 110 MCG/ACT IN AERO: 2.0000 | INHALATION_SPRAY | Freq: Two times a day (BID) | RESPIRATORY_TRACT | 5 refills | Status: DC

## 2023-03-09 MED ORDER — AMOXICILLIN-POT CLAVULANATE 875-125 MG PO TABS: 1.0000 | ORAL_TABLET | Freq: Two times a day (BID) | ORAL | 0 refills | Status: DC

## 2023-03-09 NOTE — Telephone Encounter (Signed)
Patient called stating her PCP has written her an Rx for a z-pack Dr. Maurine Minister advised if this does not help her to call back and she will write her a RX for a different antibiotic patient is also asking for an RX for an inhaler as well

## 2023-03-09 NOTE — Telephone Encounter (Signed)
Sent in augmentin and flovent for pt to [facility] [place]

## 2023-03-09 NOTE — Telephone Encounter (Signed)
Left message for pt to call us back need to know which pharmacy to send meds too

## 2023-03-09 NOTE — Telephone Encounter (Signed)
Pt returned Carries call, she states her pharmacy is [facility] [address]

## 2023-03-09 NOTE — Telephone Encounter (Signed)
Augmentin 875 twice daily for 7 to 10 days -Continue Flovent 110 2 puffs twice daily for the next 2 weeks or until coughing and symptoms subside - Can use albuterol either via nebulizer or inhaler every 4 hours scheduled while awake for the next few days  Please send in Augmentin and refill her inhalers. These were not refilled because she stated that UC and her PCP had given her many refills.

## 2023-04-02 ENCOUNTER — Encounter: Payer: Self-pay | Admitting: Internal Medicine

## 2023-04-02 ENCOUNTER — Ambulatory Visit: Payer: 59 | Admitting: Internal Medicine

## 2023-04-02 DIAGNOSIS — J3089 Other allergic rhinitis: Secondary | ICD-10-CM

## 2023-04-02 MED ORDER — EPINEPHRINE 0.3 MG/0.3ML IJ SOAJ: 0.3000 mg | INTRAMUSCULAR | 2 refills | Status: AC | PRN

## 2023-04-02 NOTE — Patient Instructions (Addendum)
Moderate persistent asthma : Daily controller medication(s): Flovent 2 puffs once a day with spacer and rinse mouth afterwards. During upper respiratory infections/asthma flares:  Increase Flovent to 2 puffs twice a day with spacer and rinse mouth afterwards for 1-2 weeks until your breathing symptoms return to baseline.  Pretreat with albuterol 2 puffs or albuterol nebulizer.  If you need to use your albuterol nebulizer machine back to back within 15-30 minutes with no relief then please go to the ER/urgent care for further evaluation.  May use albuterol rescue inhaler 2 puffs or nebulizer every 4 to 6 hours as needed for shortness of breath, chest tightness, coughing, and wheezing. May use albuterol rescue inhaler 2 puffs 5 to 15 minutes prior to strenuous physical activities. Monitor frequency of use.  Asthma control goals:  Full participation in all desired activities (may need albuterol before activity) Albuterol use two times or less a week on average (not counting use with activity) Cough interfering with sleep two times or less a month Oral steroids no more than once a year No hospitalizations   Allergic rhinitis-perennial to molds. Continue with Zyrtec 10mg  at night. Nasal saline spray (i.e., Simply Saline) or nasal saline lavage (i.e., NeilMed) is recommended as needed and prior to medicated nasal sprays. Use Flonase (fluticasone) nasal spray 2 sprays per nostril once a day as needed for nasal congestion.  Use azelastine nasal spray 1-2 sprays per nostril twice a day as needed for runny nose/drainage. Testing positive on intradermals to indoor and outdoor molds. Consider allergy injections to reduce lifetime symptoms and need for medications by teaching your immune system to become tolerant of the environmental allergens you are allergic to   Gastroesophageal reflux disease Continue dietary and lifestyle modifications.  Acid reflux meds as discussed with  ENT  Hoarseness/vocal cord issues:  Continue ENT follow-up for hoarseness/vocal cord issues  Gluten intolerance:  - recommend avoidance. Epipen not necessary. -labs 02/10/23 negative for wheat, barley, rye - skin testing today negative   Follow up 6 months, sooner if needed.  It was a pleasure seeing you again in clinic today! Thank you for allowing me to participate in your care.  Tonny Bollman, MD [facility]  Control of Mold Allergen   Mold and fungi can grow on a variety of surfaces provided certain temperature and moisture conditions exist.  Outdoor molds grow on plants, decaying vegetation and soil.  The major outdoor mold, Alternaria and Cladosporium, are found in very high numbers during hot and dry conditions.  Generally, a late Summer - Fall peak is seen for common outdoor fungal spores.  Rain will temporarily lower outdoor mold spore count, but counts rise rapidly when the rainy period ends.  The most important indoor molds are Aspergillus and Penicillium.  Dark, humid and poorly ventilated basements are ideal sites for mold growth.  The next most common sites of mold growth are the bathroom and the kitchen.  Outdoor (Seasonal) Mold Control  Use air conditioning and keep windows closed Avoid exposure to decaying vegetation. Avoid leaf raking. Avoid grain handling. Consider wearing a face mask if working in moldy areas.    Indoor (Perennial) Mold Control   Maintain humidity below 50%. Clean washable surfaces with 5% bleach solution. Remove sources e.g. contaminated carpets.

## 2023-04-02 NOTE — Progress Notes (Signed)
Date of Service/Encounter:  04/02/23  Allergy testing appointment   Previous visit on 03/06/23, seen for moderate persistent asthma, allergic rhinitis, gluten intolerance and reflux.  Please see that note for additional details.  Today reports for allergy diagnostic testing:    DIAGNOSTICS:  Skin Testing: Environmental allergy panel. Adequate positive and negative controls. Results discussed with patient/family.  Airborne Adult Perc - 04/02/23 1049     Time Antigen Placed 1049    Allergen Manufacturer Waynette Buttery    Location Back    Number of Test 55    1. Control-Buffer 50% Glycerol Negative    2. Control-Histamine 3+    3. Bahia Negative    4. French Southern Territories Negative    5. Johnson Negative    6. Kentucky Blue Negative    7. Meadow Fescue Negative    8. Perennial Rye Negative    9. Timothy Negative    10. Ragweed Mix Negative    11. Cocklebur Negative    12. Plantain,  English Negative    13. Baccharis Negative    14. Dog Fennel Negative    15. Russian Thistle Negative    16. Lamb's Quarters Negative    17. Sheep Sorrell Negative    18. Rough Pigweed Negative    19. Marsh Elder, Rough Negative    20. Mugwort, Common Negative    21. Box, Elder Negative    22. Cedar, red Negative    23. Sweet Gum Negative    24. Pecan Pollen Negative    25. Pine Mix Negative    26. Walnut, Black Pollen Negative    27. Red Mulberry Negative    28. Ash Mix Negative    29. Birch Mix Negative    30. Beech American Negative    31. Cottonwood, Guinea-Bissau Negative    32. Hickory, White Negative    33. Maple Mix Negative    34. Oak, Guinea-[place] Mix Negative    35. Sycamore Eastern Negative    36. Alternaria Alternata Negative    37. Cladosporium Herbarum Negative    38. Aspergillus Mix Negative    39. Penicillium Mix Negative    40. Bipolaris Sorokiniana (Helminthosporium) Negative    41. Drechslera Spicifera (Curvularia) Negative    42. Mucor Plumbeus Negative    43. Fusarium Moniliforme Negative     44. Aureobasidium Pullulans (pullulara) Negative    45. Rhizopus Oryzae Negative    46. Botrytis Cinera Negative    47. Epicoccum Nigrum Negative    48. Phoma Betae Negative    49. Dust Mite Mix Negative    50. Cat Hair 10,000 BAU/ml Negative    51.  Dog Epithelia Negative    52. Mixed Feathers Negative    53. Horse Epithelia Negative    54. Cockroach, German Negative    55. Tobacco Leaf Negative             Intradermal - 04/02/23 1135     Time Antigen Placed 1135    Allergen Manufacturer Waynette Buttery    Location Arm    Number of Test 16    Control Negative    Bahia Negative    French Southern Territories Negative    Johnson Negative    7 Grass Negative    Ragweed Mix Negative    Weed Mix Negative    Tree Mix Negative    Mold 1 2+    Mold 2 3+    Mold 3 3+    Mold 4 3+    Mite Mix Negative  Cat Negative    Dog Negative    Cockroach Negative             Food Adult Perc - 04/02/23 1000     Time Antigen Placed 1049    Allergen Manufacturer Waynette Buttery    Location Back    Number of allergen test 5    3. Wheat Negative    5. Milk, Cow Negative    6. Casein Negative    30. Barley Negative    31. Rye  Negative             Allergy testing results were read and interpreted by myself, documented by clinical staff.  Patient provided with copy of allergy testing along with avoidance measures when indicated.   Tonny Bollman, MD  [facility]

## 2023-06-01 ENCOUNTER — Encounter: Payer: Self-pay | Admitting: Family

## 2023-06-01 ENCOUNTER — Ambulatory Visit (INDEPENDENT_AMBULATORY_CARE_PROVIDER_SITE_OTHER): Admitting: Family

## 2023-06-01 ENCOUNTER — Ambulatory Visit (HOSPITAL_BASED_OUTPATIENT_CLINIC_OR_DEPARTMENT_OTHER)
Admission: RE | Admit: 2023-06-01 | Discharge: 2023-06-01 | Disposition: A | Discharge status: Home / Self Care | Source: Ambulatory Visit | Attending: Family | Admitting: Family

## 2023-06-01 VITALS — BP 128/82 | HR 95 | Temp 97.2°F | Resp 19

## 2023-06-01 DIAGNOSIS — J4541 Moderate persistent asthma with (acute) exacerbation: Secondary | ICD-10-CM | POA: Diagnosis not present

## 2023-06-01 DIAGNOSIS — K9049 Malabsorption due to intolerance, not elsewhere classified: Secondary | ICD-10-CM

## 2023-06-01 DIAGNOSIS — J3089 Other allergic rhinitis: Secondary | ICD-10-CM | POA: Diagnosis not present

## 2023-06-01 DIAGNOSIS — K219 Gastro-esophageal reflux disease without esophagitis: Secondary | ICD-10-CM

## 2023-06-01 DIAGNOSIS — R058 Other specified cough: Secondary | ICD-10-CM

## 2023-06-01 DIAGNOSIS — B370 Candidal stomatitis: Secondary | ICD-10-CM

## 2023-06-01 MED ORDER — NYSTATIN 100000 UNIT/ML MT SUSP: 5.0000 mL | Freq: Four times a day (QID) | OROMUCOSAL | 0 refills | Status: AC

## 2023-06-01 MED ORDER — PREDNISONE 10 MG PO TABS
ORAL_TABLET | ORAL | 0 refills | Status: DC
Start: 2023-06-01 — End: 2023-09-30

## 2023-06-01 MED ORDER — ALBUTEROL SULFATE HFA 108 (90 BASE) MCG/ACT IN AERS: 2.0000 | INHALATION_SPRAY | RESPIRATORY_TRACT | 1 refills | Status: DC | PRN

## 2023-06-01 MED ORDER — ALBUTEROL SULFATE (2.5 MG/3ML) 0.083% IN NEBU: 2.5000 mg | INHALATION_SOLUTION | RESPIRATORY_TRACT | 1 refills | Status: DC | PRN

## 2023-06-01 NOTE — Progress Notes (Signed)
 Please let Amber Steele know that her chest x-ray is normal. This is good news!!

## 2023-06-01 NOTE — Patient Instructions (Addendum)
 Moderate persistent asthma :with acute exacerbation Get STAT chest x-ray due to productive cough. We will call you with results once they are back. Start prednisone 10 mg taking 2 tablets twice a day for 3 days, then on the 4th day take 2 tablets in the morning, and on the 5th day take 1 tablet and stop If symptoms worsen or persist please go to the ER Daily controller medication(s): Flovent 2 puffs once a day with spacer and rinse mouth afterwards. During upper respiratory infections/asthma flares:  Increase Flovent to 2 puffs twice a day with spacer and rinse mouth afterwards for 1-2 weeks until your breathing symptoms return to baseline.  Pretreat with albuterol 2 puffs or albuterol nebulizer.  If you need to use your albuterol nebulizer machine back to back within 15-30 minutes with no relief then please go to the ER/urgent care for further evaluation.  May use albuterol rescue inhaler 2 puffs or nebulizer every 4 to 6 hours as needed for shortness of breath, chest tightness, coughing, and wheezing. May use albuterol rescue inhaler 2 puffs 5 to 15 minutes prior to strenuous physical activities. Monitor frequency of use.  Asthma control goals:  Full participation in all desired activities (may need albuterol before activity) Albuterol use two times or less a week on average (not counting use with activity) Cough interfering with sleep two times or less a month Oral steroids no more than once a year No hospitalizations   Allergic rhinitis-perennial to molds. Continue with Zyrtec 10mg  at night. Nasal saline spray (i.e., Simply Saline) or nasal saline lavage (i.e., NeilMed) is recommended as needed and prior to medicated nasal sprays. Use Flonase (fluticasone) nasal spray 2 sprays per nostril once a day as needed for nasal congestion.  Use azelastine nasal spray 1-2 sprays per nostril twice a day as needed for runny nose/drainage. Testing positive on intradermals to indoor and  outdoor molds. Consider allergy injections to reduce lifetime symptoms and need for medications by teaching your immune system to become tolerant of the environmental allergens you are allergic to   Gastroesophageal reflux disease Continue dietary and lifestyle modifications.    Hoarseness/vocal cord issues:  Continue ENT follow-up for hoarseness/vocal cord issues  Gluten intolerance:  - recommend avoidance. Epipen not necessary. -labs 02/10/23 negative for wheat, barley, rye - skin testing on 12/0/24 negative  Oral Thrush Start Nystatin swish and spit. Take 5 mL 4 times a day for 7 days  Keep already scheduled follow up appointment on 09/30/23 @ 11: 30 AM, sooner if needed.    Control of Mold Allergen   Mold and fungi can grow on a variety of surfaces provided certain temperature and moisture conditions exist.  Outdoor molds grow on plants, decaying vegetation and soil.  The major outdoor mold, Alternaria and Cladosporium, are found in very high numbers during hot and dry conditions.  Generally, a late Summer - Fall peak is seen for common outdoor fungal spores.  Rain will temporarily lower outdoor mold spore count, but counts rise rapidly when the rainy period ends.  The most important indoor molds are Aspergillus and Penicillium.  Dark, humid and poorly ventilated basements are ideal sites for mold growth.  The next most common sites of mold growth are the bathroom and the kitchen.  Outdoor (Seasonal) Mold Control  Use air conditioning and keep windows closed Avoid exposure to decaying vegetation. Avoid leaf raking. Avoid grain handling. Consider wearing a face mask if working in moldy areas.    Indoor (  Perennial) Mold Control   Maintain humidity below 50%. Clean washable surfaces with 5% bleach solution. Remove sources e.g. contaminated carpets.

## 2023-06-01 NOTE — Progress Notes (Signed)
 [address] [place] [address] Dept: [phone]  FOLLOW UP NOTE  Patient ID: Amber Steele, female    DOB: Aug 18, 1965  Age: 58 y.o. MRN: [id number] Date of Office Visit: 06/01/2023  Assessment  Chief Complaint: Other  HPI Amber Steele is a 58 year old female who presents today for an acute visit.  She was last seen on March 06, 2023 by Dr. Maurine Minister for moderate persistent asthma with exacerbation, allergic rhinitis-not at goal, gastroesophageal reflux disease, hoarseness/vocal cord issues, and gluten allergy versus intolerance.  She denies any new diagnosis or surgery since her last office visit.  She reports on March 11 she was diagnosed with influenza she was given little gold pills (benzonatate) that she thinks made her symptoms worse.  She is doing the bare minimum due to her recent illness.  Moderate persistent asthma: She reports a lot of coughing with productive gold/green sputum for the past several days (close to week), wheezing, tightness in her chest, and shortness of breath sometimes.  She does mention the shortness of breath is not as bad right now her cough is waking her up at night.  Her last fever was yesterday, but she is still taking Tylenol.  She has not received any systemic steroids since her last office visit.  She is using albuterol 4-5 times a day and it does help.  She is currently taking Flovent 110 mcg 2 puffs twice a day with spacer.  When she is healthy she decreases her Flovent 110 to 2 puffs at night.  She mentions that when she recently saw her dentist who told her he thought she had thrush, but did not prescribe anything.  Allergic rhinitis: She reports very little rhinorrhea and she does not really notice any color.  She has a little bit of nasal congestion and thinks that she maybe has postnasal drip..  She feels like her sinuses  are possibly irritated.  She has not been treated for any sinus infections since we last saw her.  The beginning of her  illness she had a couple nosebleeds and a few headaches.  She has using her fluticasone nasal spray daily and azelastine nasal spray daily since being sick.  She also mentions that her ears have been hurting, but feels like this is due to her having the flu  Gastroesophageal reflux disease/hoarseness/vocal cord issues: She denies any heartburn or reflux symptoms.  She is not taking the reflux medicine as recommended by ENT.  She mentions that she was supposed to go back to ENT, but has not gone back.  She did not feel like the reflux meds really helped.   Drug Allergies:  Allergies  Allergen Reactions   Sulfa Antibiotics Itching, Nausea Only and Rash   Codeine Other (See Comments)    Arm pain    Fluoxetine Hcl Other (See Comments)    suicidal   Oxycodone-Acetaminophen Other (See Comments)    Arm pain     Review of Systems: Negative except as per HPI   Physical Exam: BP 128/82   Pulse 95   Temp (!) 97.2 F (36.2 C) (Temporal)   Resp 19   SpO2 98%    Physical Exam Constitutional:      Appearance: Normal appearance.  HENT:     Head: Normocephalic and atraumatic.     Comments: Pharynx: thrush noted. Eyes normal. Ears normal. Nose normal    Right Ear: Tympanic membrane, ear canal and external ear normal.     Left Ear: Tympanic  membrane, ear canal and external ear normal.     Nose: Nose normal.     Mouth/Throat:     Mouth: Mucous membranes are moist.     Pharynx: Oropharynx is clear.  Eyes:     Conjunctiva/sclera: Conjunctivae normal.  Cardiovascular:     Rate and Rhythm: Regular rhythm.     Heart sounds: Normal heart sounds.  Pulmonary:     Effort: Pulmonary effort is normal.     Comments: Rhonchi heard throughout all lung fields Musculoskeletal:     Cervical back: Neck supple.  Skin:    General: Skin is warm.  Neurological:     Mental Status: She is alert and oriented to person, place, and time.  Psychiatric:        Mood and Affect: Mood normal.        Thought  Content: Thought content normal.        Judgment: Judgment normal.     Diagnostics:  Spirometry not completed due to recent influenza diagnosis and fevers  Assessment and Plan: 1. Moderate persistent asthma with exacerbation   2. Productive cough   3. Oral thrush   4. Other allergic rhinitis   5. Gastroesophageal reflux disease without esophagitis   6. Food intolerance     Meds ordered this encounter  Medications   albuterol (PROVENTIL) (2.5 MG/3ML) 0.083% nebulizer solution    Sig: Take 3 mLs (2.5 mg total) by nebulization every 4 (four) hours as needed for wheezing or shortness of breath.    Dispense:  75 mL    Refill:  1   albuterol (VENTOLIN HFA) 108 (90 Base) MCG/ACT inhaler    Sig: Inhale 2 puffs into the lungs every 4 (four) hours as needed for wheezing or shortness of breath (coughing fits).    Dispense:  18 g    Refill:  1   predniSONE (DELTASONE) 10 MG tablet    Sig: Take 2 tablets twice a day for 3 days, then on the 4th day take 2 tablets in the morning, and on the 5th day take one tablet and stop    Dispense:  15 tablet    Refill:  0   nystatin (MYCOSTATIN) 100000 UNIT/ML suspension    Sig: Take 5 mLs (500,000 Units total) by mouth 4 (four) times daily for 7 days. Swish and spit    Dispense:  140 mL    Refill:  0    Patient Instructions  Moderate persistent asthma :with acute exacerbation Get STAT chest x-ray due to productive cough. We will call you with results once they are back. Start prednisone 10 mg taking 2 tablets twice a day for 3 days, then on the 4th day take 2 tablets in the morning, and on the 5th day take 1 tablet and stop If symptoms worsen or persist please go to the ER Daily controller medication(s): Flovent 2 puffs once a day with spacer and rinse mouth afterwards. During upper respiratory infections/asthma flares:  Increase Flovent to 2 puffs twice a day with spacer and rinse mouth afterwards for 1-2 weeks until your breathing  symptoms return to baseline.  Pretreat with albuterol 2 puffs or albuterol nebulizer.  If you need to use your albuterol nebulizer machine back to back within 15-30 minutes with no relief then please go to the ER/urgent care for further evaluation.  May use albuterol rescue inhaler 2 puffs or nebulizer every 4 to 6 hours as needed for shortness of breath, chest tightness, coughing, and wheezing.  May use albuterol rescue inhaler 2 puffs 5 to 15 minutes prior to strenuous physical activities. Monitor frequency of use.  Asthma control goals:  Full participation in all desired activities (may need albuterol before activity) Albuterol use two times or less a week on average (not counting use with activity) Cough interfering with sleep two times or less a month Oral steroids no more than once a year No hospitalizations   Allergic rhinitis-perennial to molds. Continue with Zyrtec 10mg  at night. Nasal saline spray (i.e., Simply Saline) or nasal saline lavage (i.e., NeilMed) is recommended as needed and prior to medicated nasal sprays. Use Flonase (fluticasone) nasal spray 2 sprays per nostril once a day as needed for nasal congestion.  Use azelastine nasal spray 1-2 sprays per nostril twice a day as needed for runny nose/drainage. Testing positive on intradermals to indoor and outdoor molds. Consider allergy injections to reduce lifetime symptoms and need for medications by teaching your immune system to become tolerant of the environmental allergens you are allergic to   Gastroesophageal reflux disease Continue dietary and lifestyle modifications.    Hoarseness/vocal cord issues:  Continue ENT follow-up for hoarseness/vocal cord issues  Gluten intolerance:  - recommend avoidance. Epipen not necessary. -labs 02/10/23 negative for wheat, barley, rye - skin testing on 12/0/24 negative  Oral Thrush Start Nystatin swish and spit. Take 5 mL 4 times a day for 7 days  Keep already scheduled follow  up appointment on 09/30/23 @ 11: 30 AM, sooner if needed.    Control of Mold Allergen   Mold and fungi can grow on a variety of surfaces provided certain temperature and moisture conditions exist.  Outdoor molds grow on plants, decaying vegetation and soil.  The major outdoor mold, Alternaria and Cladosporium, are found in very high numbers during hot and dry conditions.  Generally, a late Summer - Fall peak is seen for common outdoor fungal spores.  Rain will temporarily lower outdoor mold spore count, but counts rise rapidly when the rainy period ends.  The most important indoor molds are Aspergillus and Penicillium.  Dark, humid and poorly ventilated basements are ideal sites for mold growth.  The next most common sites of mold growth are the bathroom and the kitchen.  Outdoor (Seasonal) Mold Control  Use air conditioning and keep windows closed Avoid exposure to decaying vegetation. Avoid leaf raking. Avoid grain handling. Consider wearing a face mask if working in moldy areas.    Indoor (Perennial) Mold Control   Maintain humidity below 50%. Clean washable surfaces with 5% bleach solution. Remove sources e.g. contaminated carpets.    Return in about 4 months (around 09/30/2023), or if symptoms worsen or fail to improve.    Thank you for the opportunity to care for this patient.  Please do not hesitate to contact me with questions.  Nehemiah Settle, FNP [facility]

## 2023-06-08 ENCOUNTER — Encounter: Payer: Self-pay | Admitting: Internal Medicine

## 2023-06-08 NOTE — Telephone Encounter (Signed)
 How long did hives last and how long after taking nystatin did they start?

## 2023-06-08 NOTE — Telephone Encounter (Signed)
 Send in fluconazole 150 mg and repeat in 3 days.

## 2023-06-09 ENCOUNTER — Other Ambulatory Visit: Payer: Self-pay | Admitting: Internal Medicine

## 2023-06-09 MED ORDER — FLUCONAZOLE 100 MG PO TABS: ORAL_TABLET | ORAL | 0 refills | Status: AC

## 2023-06-09 NOTE — Telephone Encounter (Signed)
 Hi-sorry, I sent it in. Needed to check doses as don't usually use for oral candida. Thanks for asking for clarification.

## 2023-06-09 NOTE — Progress Notes (Signed)
 Diflucan order placed.

## 2023-07-27 ENCOUNTER — Encounter: Payer: Self-pay | Admitting: Internal Medicine

## 2023-07-28 NOTE — Telephone Encounter (Signed)
 Please have her reach out to ENT regarding symptoms. It is important to visualize what is actually happening in her throat so that we know what we are actually treating.

## 2023-09-30 ENCOUNTER — Encounter: Payer: Self-pay | Admitting: Internal Medicine

## 2023-09-30 ENCOUNTER — Ambulatory Visit (INDEPENDENT_AMBULATORY_CARE_PROVIDER_SITE_OTHER): Payer: 59 | Admitting: Internal Medicine

## 2023-09-30 VITALS — BP 122/78 | HR 88 | Temp 96.4°F | Wt 238.8 lb

## 2023-09-30 DIAGNOSIS — K219 Gastro-esophageal reflux disease without esophagitis: Secondary | ICD-10-CM

## 2023-09-30 DIAGNOSIS — J3089 Other allergic rhinitis: Secondary | ICD-10-CM | POA: Diagnosis not present

## 2023-09-30 DIAGNOSIS — J4540 Moderate persistent asthma, uncomplicated: Secondary | ICD-10-CM

## 2023-09-30 MED ORDER — ALBUTEROL SULFATE HFA 108 (90 BASE) MCG/ACT IN AERS: 2.0000 | INHALATION_SPRAY | RESPIRATORY_TRACT | 1 refills | Status: DC | PRN

## 2023-09-30 MED ORDER — ALBUTEROL SULFATE (2.5 MG/3ML) 0.083% IN NEBU: 2.5000 mg | INHALATION_SOLUTION | RESPIRATORY_TRACT | 1 refills | Status: AC | PRN

## 2023-09-30 MED ORDER — FLUTICASONE PROPIONATE HFA 110 MCG/ACT IN AERO: 2.0000 | INHALATION_SPRAY | Freq: Two times a day (BID) | RESPIRATORY_TRACT | 5 refills | Status: DC

## 2023-09-30 NOTE — Progress Notes (Signed)
 FOLLOW UP Date of Service/Encounter:   09/30/2023  Subjective:  Amber Steele (DOB: 1965-11-16) is a 58 y.o. female who [facility] in re-evaluation of the following: asthma, mixed rhinitis, food intolerance and GERD History obtained from: chart review and patient.  For Review, LV was on 06/01/23  with Wanda Craze, FNP seen for routine follow-up. See below for summary of history and diagnostics.   Therapeutic plans/changes recommended: acute symptoms of difficulty breathing following recent flu diagnosis; CXR obtained and normal; prednisone  burst given. ----------------------------------------------------- Pertinent History/Diagnostics:  Moderate persistent asthma without complication  Rare use of albuterol . Off Singulair  as of 01/2023.  Using Flovent  110 2 puffs daily. 2020 CXR normal Other allergic rhinitis Past history - Anosmia since January 2022 - no formal COVID-19 diagnosis.  Did allergy  injections over 20 years ago.  04/02/23: negative skin testing, IDs positive to indoor/outdoor allergens Food intolerance Gluten causes immediate gastrointestinal distress, including stomach rolling, urgent bowel movements, and dry heaving  IgE rye, barley and wheat all negative -SPT 04/02/23: negative Reflux:  Prescribed acid reflux medications by ENT --------------------------------------------------- Today presents for follow-up. Discussed the use of AI scribe software for clinical note transcription with the patient, who gave verbal consent to proceed.  History of Present Illness Amber Steele is a 58 year old female with asthma and reflux who presents for follow-up of her respiratory and allergy  symptoms.  Asthma symptoms and exacerbations - No major asthma attacks since March flu-related exacerbation - No recent use of rescue inhaler since recovery from March exacerbation - March exacerbation required approximately six weeks to resolve -  Continues daily Flovent  (two puffs once daily) - Avoids sudden temperature changes to prevent exacerbations  Allergic rhinitis symptoms and management - Manages symptoms with daily Zyrtec  and Flonase  (used approximately six days per week) - Uses azelastine  sparingly for worsening allergy  symptoms due to cost  Gastroesophageal reflux symptoms - Experienced left shoulder pain after discontinuing omeprazole for one to two months - Shoulder pain improved upon resuming omeprazole 40 mg daily (prescribed by ENT)  Thyroid dysfunction and medication adherence - History of thyroid dysfunction managed with levothyroxine and liothyronine - Temporary cessation of thyroid medications for five to six days due to insurance issues  Oropharyngeal symptoms - Intermittent throat symptoms described as 'strings' or 'lines of bumps' in the throat - Symptoms associated with stress or medication changes - Symptoms transient and not consistently present during medical evaluations - History of thrush-like symptoms, but no recent diagnosis of thrush    All medications reviewed by clinical staff and updated in chart. No new pertinent medical or surgical history except as noted in HPI.  ROS: All others negative except as noted per HPI.   Objective:  BP 122/78 (BP Location: Right Arm, Patient Position: Sitting)   Pulse 88   Temp (!) 96.4 F (35.8 C) (Temporal)   Wt 238 lb 12.8 oz (108.3 kg)   SpO2 97%   BMI 38.54 kg/m  Body mass index is 38.54 kg/m. Physical Exam: General Appearance:  Alert, cooperative, no distress, appears stated age  Head:  Normocephalic, without obvious abnormality, atraumatic  Eyes:  Conjunctiva clear, EOM's intact  Ears EACs normal bilaterally and normal TMs bilaterally  Nose: Nares normal, hypertrophic turbinates, normal mucosa, and no visible anterior polyps  Throat: Lips, tongue normal; teeth and gums normal, normal posterior oropharynx  Neck: Supple, symmetrical  Lungs:    clear to auscultation bilaterally, Respirations unlabored, no coughing  Heart:  regular rate and rhythm and no murmur, Appears well perfused  Extremities: No edema  Skin: Skin color, texture, turgor normal and no rashes or lesions on visualized portions of skin  Neurologic: No gross deficits   Labs:  Lab Orders  No laboratory test(s) ordered today    Spirometry:  Tracings reviewed. Her effort: Good reproducible efforts. FVC: 3.71L FEV1: 3.03L, 104% predicted FEV1/FVC ratio: 0.82 Interpretation: Spirometry consistent with normal pattern.  Please see scanned spirometry results for details.  Assessment/Plan   Moderate persistent asthma : at goal Normal spirometry today Daily controller medication(s): Flovent  110mcg 2 puffs once a day with spacer and rinse mouth afterwards. During upper respiratory infections/asthma flares:  Increase Flovent  to 2 puffs twice a day with spacer and rinse mouth afterwards for 1-2 weeks until your breathing symptoms return to baseline.  Pretreat with albuterol  2 puffs or albuterol  nebulizer.  If you need to use your albuterol  nebulizer machine back to back within 15-30 minutes with no relief then please go to the ER/urgent care for further evaluation.  May use albuterol  rescue inhaler 2 puffs or nebulizer every 4 to 6 hours as needed for shortness of breath, chest tightness, coughing, and wheezing. May use albuterol  rescue inhaler 2 puffs 5 to 15 minutes prior to strenuous physical activities. Monitor frequency of use.  Asthma control goals:  Full participation in all desired activities (may need albuterol  before activity) Albuterol  use two times or less a week on average (not counting use with activity) Cough interfering with sleep two times or less a month Oral steroids no more than once a year No hospitalizations   Allergic rhinitis-perennial to molds. At goal Continue with Zyrtec  10mg  at night. Nasal saline spray (i.e., Simply Saline) or  nasal saline lavage (i.e., NeilMed) is recommended as needed and prior to medicated nasal sprays. Use Flonase  (fluticasone ) nasal spray 2 sprays per nostril once a day as needed for nasal congestion.  Use azelastine  nasal spray 1-2 sprays per nostril twice a day as needed for runny nose/drainage. 04/02/23: Testing positive on intradermals to indoor and outdoor molds. Consider allergy  injections to reduce lifetime symptoms and need for medications by teaching your immune system to become tolerant of the environmental allergens you are allergic to   Gastroesophageal reflux disease-at goal Continue dietary and lifestyle modifications.  Continue omeprazole 40 mg daily as prescribed by ENT  Hoarseness/vocal cord issues: stable Continue ENT follow-up for hoarseness/vocal cord issues  Gluten intolerance: stable - recommend avoidance. Epipen  not necessary. -labs 02/10/23 negative for wheat, barley, rye - skin testing on 12/0/24 negative  Follow up : 6 months, sooner if needed It was a pleasure seeing you again in clinic today! Thank you for allowing me to participate in your care.  Other: none  Rocky Endow, MD  [facility] 

## 2023-09-30 NOTE — Patient Instructions (Addendum)
 Moderate persistent asthma : Normal spirometry today Daily controller medication(s): Flovent  110mcg 2 puffs once a day with spacer and rinse mouth afterwards. During upper respiratory infections/asthma flares:  Increase Flovent  to 2 puffs twice a day with spacer and rinse mouth afterwards for 1-2 weeks until your breathing symptoms return to baseline.  Pretreat with albuterol  2 puffs or albuterol  nebulizer.  If you need to use your albuterol  nebulizer machine back to back within 15-30 minutes with no relief then please go to the ER/urgent care for further evaluation.  May use albuterol  rescue inhaler 2 puffs or nebulizer every 4 to 6 hours as needed for shortness of breath, chest tightness, coughing, and wheezing. May use albuterol  rescue inhaler 2 puffs 5 to 15 minutes prior to strenuous physical activities. Monitor frequency of use.  Asthma control goals:  Full participation in all desired activities (may need albuterol  before activity) Albuterol  use two times or less a week on average (not counting use with activity) Cough interfering with sleep two times or less a month Oral steroids no more than once a year No hospitalizations   Allergic rhinitis-perennial to molds. Continue with Zyrtec  10mg  at night. Nasal saline spray (i.e., Simply Saline) or nasal saline lavage (i.e., NeilMed) is recommended as needed and prior to medicated nasal sprays. Use Flonase  (fluticasone ) nasal spray 2 sprays per nostril once a day as needed for nasal congestion.  Use azelastine  nasal spray 1-2 sprays per nostril twice a day as needed for runny nose/drainage. 04/02/23: Testing positive on intradermals to indoor and outdoor molds. Consider allergy  injections to reduce lifetime symptoms and need for medications by teaching your immune system to become tolerant of the environmental allergens you are allergic to   Gastroesophageal reflux disease Continue dietary and lifestyle modifications.  Continue  omeprazole 40 mg daily as prescribed by ENT  Hoarseness/vocal cord issues:  Continue ENT follow-up for hoarseness/vocal cord issues  Gluten intolerance:  - recommend avoidance. Epipen  not necessary. -labs 02/10/23 negative for wheat, barley, rye - skin testing on 12/0/24 negative  Follow up : 6 months, sooner if needed It was a pleasure seeing you again in clinic today! Thank you for allowing me to participate in your care.  Rocky Endow, MD [facility]   Control of Mold Allergen   Mold and fungi can grow on a variety of surfaces provided certain temperature and moisture conditions exist.  Outdoor molds grow on plants, decaying vegetation and soil.  The major outdoor mold, Alternaria and Cladosporium, are found in very high numbers during hot and dry conditions.  Generally, a late Summer - Fall peak is seen for common outdoor fungal spores.  Rain will temporarily lower outdoor mold spore count, but counts rise rapidly when the rainy period ends.  The most important indoor molds are Aspergillus and Penicillium.  Dark, humid and poorly ventilated basements are ideal sites for mold growth.  The next most common sites of mold growth are the bathroom and the kitchen.  Outdoor (Seasonal) Mold Control  Use air conditioning and keep windows closed Avoid exposure to decaying vegetation. Avoid leaf raking. Avoid grain handling. Consider wearing a face mask if working in moldy areas.    Indoor (Perennial) Mold Control   Maintain humidity below 50%. Clean washable surfaces with 5% bleach solution. Remove sources e.g. contaminated carpets.

## 2023-10-13 ENCOUNTER — Other Ambulatory Visit: Payer: Self-pay | Admitting: *Deleted

## 2023-10-13 MED ORDER — ALBUTEROL SULFATE HFA 108 (90 BASE) MCG/ACT IN AERS: 2.0000 | INHALATION_SPRAY | RESPIRATORY_TRACT | 1 refills | Status: AC | PRN

## 2023-10-13 MED ORDER — FLUTICASONE PROPIONATE HFA 110 MCG/ACT IN AERO: 2.0000 | INHALATION_SPRAY | Freq: Two times a day (BID) | RESPIRATORY_TRACT | 1 refills | Status: AC
